# Patient Record
Sex: Female | Born: 1945 | Race: White | Hispanic: No | Marital: Married | Smoking: Former smoker
Health system: Southern US, Community
[De-identification: ages and names within clinical notes are randomized; demographics above are authoritative.]

## PROBLEM LIST (undated history)

## (undated) DIAGNOSIS — I609 Nontraumatic subarachnoid hemorrhage, unspecified: Secondary | ICD-10-CM

## (undated) DIAGNOSIS — I10 Essential (primary) hypertension: Secondary | ICD-10-CM

## (undated) HISTORY — PX: HERNIA REPAIR: SHX51

## (undated) HISTORY — PX: BREAST SURGERY: SHX581

## (undated) HISTORY — PX: BREAST EXCISIONAL BIOPSY: SUR124

## (undated) HISTORY — PX: VEIN LIGATION AND STRIPPING: SHX2653

## (undated) HISTORY — PX: OTHER SURGICAL HISTORY: SHX169

---

## 2017-06-15 DIAGNOSIS — R9431 Abnormal electrocardiogram [ECG] [EKG]: Secondary | ICD-10-CM | POA: Insufficient documentation

## 2017-06-15 DIAGNOSIS — E782 Mixed hyperlipidemia: Secondary | ICD-10-CM | POA: Insufficient documentation

## 2017-06-15 DIAGNOSIS — R002 Palpitations: Secondary | ICD-10-CM | POA: Insufficient documentation

## 2017-07-06 DIAGNOSIS — R7401 Elevation of levels of liver transaminase levels: Secondary | ICD-10-CM | POA: Insufficient documentation

## 2019-01-09 ENCOUNTER — Encounter: Payer: Self-pay | Admitting: Family Medicine

## 2019-01-14 ENCOUNTER — Ambulatory Visit (INDEPENDENT_AMBULATORY_CARE_PROVIDER_SITE_OTHER): Payer: Medicare Other

## 2019-01-14 ENCOUNTER — Encounter: Payer: Self-pay | Admitting: Podiatry

## 2019-01-14 ENCOUNTER — Ambulatory Visit (INDEPENDENT_AMBULATORY_CARE_PROVIDER_SITE_OTHER): Payer: Medicare Other | Admitting: Podiatry

## 2019-01-14 ENCOUNTER — Other Ambulatory Visit: Payer: Self-pay

## 2019-01-14 VITALS — BP 127/61 | HR 68

## 2019-01-14 DIAGNOSIS — M19072 Primary osteoarthritis, left ankle and foot: Secondary | ICD-10-CM

## 2019-01-14 DIAGNOSIS — M2062 Acquired deformities of toe(s), unspecified, left foot: Secondary | ICD-10-CM

## 2019-01-14 DIAGNOSIS — M205X1 Other deformities of toe(s) (acquired), right foot: Secondary | ICD-10-CM

## 2019-01-14 DIAGNOSIS — M2060 Acquired deformities of toe(s), unspecified, unspecified foot: Secondary | ICD-10-CM | POA: Insufficient documentation

## 2019-01-14 NOTE — Progress Notes (Addendum)
This patient presents to the office for an evaluation and treatment of both her feet.  She presents the office stating she has a painful callus on the tip of the fourth toe right foot and a painful callus under her big toe left foot.  She says she has had multiple surgeries on her feet.  She says the callus on the tip of her fourth toe right foot is painful walking and wearing her shoes.  She has a callus under her big toe and along the first and second toes left foot.  Patient has had multiple surgeries to the first ray of her left foot.  She says that her big toe sits up and her second toe rubs against it forming a callus.  She also has a callus under the joint in the big toe left foot.  She presents the office today requesting treatment for the elimination of the pain caused by the fourth toe right foot and great toe left foot.  Vascular  Dorsalis pedis and posterior tibial pulses are palpable  B/L.  Capillary return  WNL.  Temperature gradient is  WNL.  Skin turgor  WNL  Sensorium  Senn Weinstein monofilament wire  WNL. Normal tactile sensation.  Nail Exam  Patient has normal nails with no evidence of bacterial or fungal infection.  Orthopedic  Exam  Muscle tone and muscle strength  WNL.  Examination of the big toe joint of the left foot reveals the absence of motion.  The distal phalanx of the left hallux is in a dorsiflexed position relative to the IPJ left foot.  Patient also has a contracted fourth digit which leads to contracture and callus formation.  Midfoot arthritis.  Skin  No open lesions.  Normal skin texture and turgor.  Callus distal aspect 4th toe right foot.  Callus under plantar condyle head proximal phalanx left hallux.   Callus 1/2 left foot.  Mallet toe 4th toe right foot.  Arthrodesis revealing no motion 1st MPJ left foot.  IE.  X-rays taken revealed complete fusion IPJ left foot.  There appears to be monofilament wire which indicates an aching deformity was corrected surgically  patient also has a medial deviation of the second through fourth digits left foot.  Discussed this condition with this patient.  Recommended she be evaluated by Dr. March Rummage in [place] for a flexor tenotomy fourth toe right and surgical consultation for calluses left hallux hallux.  RTC prn   Gardiner Barefoot DPM.

## 2019-02-13 ENCOUNTER — Other Ambulatory Visit: Payer: Self-pay

## 2019-02-13 ENCOUNTER — Encounter (INDEPENDENT_AMBULATORY_CARE_PROVIDER_SITE_OTHER): Payer: Self-pay

## 2019-02-13 ENCOUNTER — Ambulatory Visit (INDEPENDENT_AMBULATORY_CARE_PROVIDER_SITE_OTHER): Payer: Medicare Other | Admitting: Gastroenterology

## 2019-02-13 VITALS — BP 111/58 | HR 62 | Temp 98.1°F | Ht 66.0 in | Wt 141.8 lb

## 2019-02-13 DIAGNOSIS — K219 Gastro-esophageal reflux disease without esophagitis: Secondary | ICD-10-CM

## 2019-02-13 DIAGNOSIS — R6881 Early satiety: Secondary | ICD-10-CM

## 2019-02-13 NOTE — Progress Notes (Signed)
Jonathon Bellows MD, MRCP(U.K) [address]  [place]  [place], [place] [address]  Main: [phone]  Fax: [phone]   Gastroenterology Consultation  Referring Provider:     Hetty Blend, DO Primary Care Physician:  Hetty Blend, DO Primary Gastroenterologist:  Dr. Jonathon Bellows  Reason for Consultation:     Barrett's esophagus and hiatal hernia        HPI:   Diane Kirby is a 73 y.o. y/o female referred for consultation & management  by Dr. Lafe Garin, Camelia Phenes, DO.    She says that she has had Barrett's esophagus for almost 30 years.  Last endoscopy was in 2019.  We did have the report scanned which showed a normal esophagus but showed a hiatal hernia.  Biopsies were taken for celiac disease and to rule out H. pylori.  I could not find any results of the same.  She was apprised that her esophagus showed no Barrett's on her last report.  She states she has been having features of early satiety and feels that the food sits in the stomach for hours for the last few months.  She denies suffering from diabetes or taking any pain medications.  She says that when she moves she feels that the food comes up into her throat.  She takes omeprazole 40 mg once a day.  She has gained weight recently.  No past medical history on file.  No past surgical history on file.  Prior to Admission medications   Medication Sig Start Date End Date Taking? Authorizing Provider  amLODipine (NORVASC) 10 MG tablet  12/22/18   [provider]  atenolol (TENORMIN) 50 MG tablet Take by mouth.    [provider]  Calcium Carbonate (CALCIUM 600 PO) Take by mouth daily.    [provider]  Cholecalciferol (VITAMIN D3 PO) Take by mouth. 5000    [provider]  FLUoxetine (PROZAC) 40 MG capsule Take by mouth.    [provider]  fluticasone Asencion Islam) 50 MCG/ACT nasal spray  06/13/18   [provider]  ipratropium (ATROVENT) 0.03 % nasal spray   04/06/18   [provider]  Multiple Vitamin (MULTIVITAMIN) capsule Take 1 capsule by mouth daily.    [provider]  omeprazole (PRILOSEC) 40 MG capsule  12/11/18   [provider]  pravastatin (PRAVACHOL) 40 MG tablet  12/25/18   [provider]  traZODone (DESYREL) 50 MG tablet  12/05/18   [provider]    No family history on file.   Social History   Tobacco Use  . Smoking status: Never Smoker  . Smokeless tobacco: Never Used  Substance Use Topics  . Alcohol use: Yes    Frequency: Never    Comment: 2 glasses of wine daily  . Drug use: Never    Allergies as of 02/13/2019  . (No Known Allergies)    Review of Systems:    All systems reviewed and negative except where noted in HPI.   Physical Exam:  BP (!) 111/58   Pulse 62   Temp 98.1 F (36.7 C)   Ht 5\' 6"  (1.676 m)   Wt 141 lb 12.8 oz (64.3 kg)   BMI 22.89 kg/m  No LMP recorded. Psych:  Alert and cooperative. Normal mood and affect. General:   Alert,  Well-developed, well-nourished, pleasant and cooperative in NAD Head:  Normocephalic and atraumatic. Eyes:  Sclera clear, no icterus.   Conjunctiva pink. Ears:  Normal auditory acuity.  Nose:  No deformity, discharge, or lesions. Mouth:  No deformity or lesions,oropharynx pink & moist. Neck:  Supple; no masses or thyromegaly. Lungs:  Respirations even and unlabored.  Clear throughout to auscultation.   No wheezes, crackles, or rhonchi. No acute distress. Heart:  Regular rate and rhythm; no murmurs, clicks, rubs, or gallops. Abdomen:  Normal bowel sounds.  No bruits.  Soft, non-tender and non-distended without masses, hepatosplenomegaly or hernias noted.  No guarding or rebound tenderness.    Neurologic:  Alert and oriented x3;  grossly normal neurologically. Skin:  Intact without significant lesions or rashes. No jaundice. Lymph Nodes:  No significant cervical adenopathy. Psych:  Alert and cooperative. Normal mood and  affect.  Imaging Studies: Dg Foot Complete Left  Result Date: 01/14/2019 Please see detailed radiograph report in office note.   Assessment and Plan:   Diane Kirby is a 73 y.o. y/o female has been referred for Barrett's esophagus.  Last upper endoscopy report in 2019 at [facility] [place] does not show any evidence of Barrett's esophagus.  Rather it shows a normal-appearing esophagus.  This is an contrast to her personal history which states that she has had it for over 20 years.  In addition she demonstrates symptoms suggestive of possible gastroparesis.   Plan 1.  EGD to evaluate for gastric outlet obstruction, evaluate esophagus to confirm there is no evidence of Barrett's. 2.  Gastric emptying study 3.  Continue PPI 4.  GERD discussed lifestyle changes. I have discussed alternative options, risks & benefits,  which include, but are not limited to, bleeding, infection, perforation,respiratory complication & drug reaction.  The patient agrees with this plan & written consent will be obtained.    Follow up in 6 weeks   Dr Wyline Mood MD,MRCP(U.K)

## 2019-02-13 NOTE — Patient Instructions (Signed)
Gastroparesis ° °Gastroparesis is a condition in which food takes longer than normal to empty from the stomach. The condition is usually long-lasting (chronic). It may also be called delayed gastric emptying. °There is no cure, but there are treatments and things that you can do at home to help relieve symptoms. Treating the underlying condition that causes gastroparesis can also help relieve symptoms. °What are the causes? °In many cases, the cause of this condition is not known. Possible causes include: °· A hormone (endocrine) disorder, such as hypothyroidism or diabetes. °· A nervous system disease, such as Parkinson's disease or multiple sclerosis. °· Cancer, infection, or surgery that affects the stomach or vagus nerve. The vagus nerve runs from your chest, through your neck, to the lower part of your brain. °· A connective tissue disorder, such as scleroderma. °· Certain medicines. °What increases the risk? °You are more likely to develop this condition if you: °· Have certain disorders or diseases, including: °? An endocrine disorder. °? An eating disorder. °? Amyloidosis. °? Scleroderma. °? Parkinson's disease. °? Multiple sclerosis. °? Cancer or infection of the stomach or the vagus nerve. °· Have had surgery on the stomach or vagus nerve. °· Take certain medicines. °· Are female. °What are the signs or symptoms? °Symptoms of this condition include: °· Feeling full after eating very little. °· Nausea. °· Vomiting. °· Heartburn. °· Abdominal bloating. °· Inconsistent blood sugar (glucose) levels on blood tests. °· Lack of appetite. °· Weight loss. °· Acid from the stomach coming up into the esophagus (gastroesophageal reflux). °· Sudden tightening (spasm) of the stomach, which can be painful. °Symptoms may come and go. Some people may not notice any symptoms. °How is this diagnosed? °This condition is diagnosed with tests, such as: °· Tests that check how long it takes food to move through the stomach and  intestines. These tests include: °? Upper gastrointestinal (GI) series. For this test, you drink a liquid that shows up well on X-rays, and then X-rays will be taken of your intestines. °? Gastric emptying scintigraphy. For this test, you eat food that contains a small amount of radioactive material, and then scans are taken. °? Wireless capsule GI monitoring system. For this test, you swallow a pill (capsule) that records information about how foods and fluid move through your stomach. °· Gastric manometry. For this test, a tube is passed down your throat and into your stomach to measure electrical and muscular activity. °· Endoscopy. For this test, a long, thin tube is passed down your throat and into your stomach to check for problems in your stomach lining. °· Ultrasound. This test uses sound waves to create images of inside the body. This can help rule out gallbladder disease or pancreatitis as a cause of your symptoms. °How is this treated? °There is no cure for gastroparesis. Treatment may include: °· Treating the underlying cause. °· Managing your symptoms by making changes to your diet and exercise habits. °· Taking medicines to control nausea and vomiting and to stimulate stomach muscles. °· Getting food through a feeding tube in the hospital. This may be done in severe cases. °· Having surgery to insert a device into your body that helps improve stomach emptying and control nausea and vomiting (gastric neurostimulator). °Follow these instructions at home: °· Take over-the-counter and prescription medicines only as told by your health care provider. °· Follow instructions from your health care provider about eating or drinking restrictions. Your health care provider may recommend that you: °? Eat   smaller meals more often. °? Eat low-fat foods. °? Eat low-fiber forms of high-fiber foods. For example, eat cooked vegetables instead of raw vegetables. °? Have only liquid foods instead of solid foods. Liquid  foods are easier to digest. °· Drink enough fluid to keep your urine pale yellow. °· Exercise as often as told by your health care provider. °· Keep all follow-up visits as told by your health care provider. This is important. °Contact a health care provider if you: °· Notice that your symptoms do not improve with treatment. °· Have new symptoms. °Get help right away if you: °· Have severe abdominal pain that does not improve with treatment. °· Have nausea that is severe or does not go away. °· Cannot drink fluids without vomiting. °Summary °· Gastroparesis is a chronic condition in which food takes longer than normal to empty from the stomach. °· Symptoms include nausea, vomiting, heartburn, abdominal bloating, and loss of appetite. °· Eating smaller portions, and low-fat, low-fiber foods may help you manage your symptoms. °· Get help right away if you have severe abdominal pain. °This information is not intended to replace advice given to you by your health care provider. Make sure you discuss any questions you have with your health care provider. °Document Released: 04/11/2005 Document Revised: 07/10/2017 Document Reviewed: 02/14/2017 °Elsevier Patient Education © 2020 [employer] Inc. ° °

## 2019-02-21 ENCOUNTER — Ambulatory Visit: Payer: Medicare Other | Admitting: Podiatry

## 2019-02-22 ENCOUNTER — Other Ambulatory Visit: Payer: Self-pay

## 2019-02-22 ENCOUNTER — Other Ambulatory Visit
Admission: RE | Admit: 2019-02-22 | Discharge: 2019-02-22 | Disposition: A | Discharge status: Home / Self Care | Payer: Medicare Other | Source: Ambulatory Visit | Attending: Gastroenterology | Admitting: Gastroenterology

## 2019-02-22 DIAGNOSIS — Z01812 Encounter for preprocedural laboratory examination: Secondary | ICD-10-CM | POA: Insufficient documentation

## 2019-02-22 DIAGNOSIS — Z20828 Contact with and (suspected) exposure to other viral communicable diseases: Secondary | ICD-10-CM | POA: Diagnosis not present

## 2019-02-22 LAB — SARS CORONAVIRUS 2 (TAT 6-24 HRS): SARS Coronavirus 2: NEGATIVE

## 2019-02-25 ENCOUNTER — Encounter: Payer: Self-pay | Admitting: *Deleted

## 2019-02-25 ENCOUNTER — Other Ambulatory Visit: Payer: Self-pay

## 2019-02-25 ENCOUNTER — Telehealth: Payer: Self-pay | Admitting: Gastroenterology

## 2019-02-25 ENCOUNTER — Encounter: Payer: Self-pay | Admitting: Gastroenterology

## 2019-02-25 ENCOUNTER — Ambulatory Visit
Admission: RE | Admit: 2019-02-25 | Discharge: 2019-02-25 | Disposition: A | Discharge status: Home / Self Care | Payer: Medicare Other | Source: Ambulatory Visit | Attending: Gastroenterology | Admitting: Gastroenterology

## 2019-02-25 DIAGNOSIS — R6881 Early satiety: Secondary | ICD-10-CM | POA: Insufficient documentation

## 2019-02-25 MED ORDER — TECHNETIUM TC 99M SULFUR COLLOID
2.0000 | Freq: Once | INTRAVENOUS | Status: AC | PRN
  Administered 2019-02-25: 2.82 via ORAL

## 2019-02-25 NOTE — Telephone Encounter (Signed)
Pt daughter left vm she states pt will not cancel the procedure she  Was just frustrated bc she could not find the location of the medical  Mall please keep apt

## 2019-02-25 NOTE — Telephone Encounter (Signed)
Pt left vm to cancel her procedure with Dr. Vicente Males on 02/26/19

## 2019-02-26 ENCOUNTER — Encounter
Admission: RE | Disposition: A | Discharge status: Home / Self Care | Payer: Self-pay | Source: Ambulatory Visit | Attending: Gastroenterology

## 2019-02-26 ENCOUNTER — Ambulatory Visit
Admission: RE | Admit: 2019-02-26 | Discharge: 2019-02-26 | Disposition: A | Discharge status: Home / Self Care | Payer: Medicare Other | Source: Ambulatory Visit | Attending: Gastroenterology | Admitting: Gastroenterology

## 2019-02-26 ENCOUNTER — Other Ambulatory Visit: Payer: Self-pay

## 2019-02-26 ENCOUNTER — Ambulatory Visit: Payer: Medicare Other | Admitting: Certified Registered Nurse Anesthetist

## 2019-02-26 ENCOUNTER — Encounter: Payer: Self-pay | Admitting: *Deleted

## 2019-02-26 DIAGNOSIS — K2970 Gastritis, unspecified, without bleeding: Secondary | ICD-10-CM | POA: Diagnosis not present

## 2019-02-26 DIAGNOSIS — Z87891 Personal history of nicotine dependence: Secondary | ICD-10-CM | POA: Insufficient documentation

## 2019-02-26 DIAGNOSIS — Z7951 Long term (current) use of inhaled steroids: Secondary | ICD-10-CM | POA: Insufficient documentation

## 2019-02-26 DIAGNOSIS — K319 Disease of stomach and duodenum, unspecified: Secondary | ICD-10-CM | POA: Insufficient documentation

## 2019-02-26 DIAGNOSIS — Z79899 Other long term (current) drug therapy: Secondary | ICD-10-CM | POA: Insufficient documentation

## 2019-02-26 DIAGNOSIS — R6881 Early satiety: Secondary | ICD-10-CM | POA: Diagnosis not present

## 2019-02-26 DIAGNOSIS — K2270 Barrett's esophagus without dysplasia: Secondary | ICD-10-CM

## 2019-02-26 DIAGNOSIS — K219 Gastro-esophageal reflux disease without esophagitis: Secondary | ICD-10-CM | POA: Diagnosis not present

## 2019-02-26 HISTORY — PX: ESOPHAGOGASTRODUODENOSCOPY (EGD) WITH PROPOFOL: SHX5813

## 2019-02-26 HISTORY — DX: Nontraumatic subarachnoid hemorrhage, unspecified: I60.9

## 2019-02-26 SURGERY — ESOPHAGOGASTRODUODENOSCOPY (EGD) WITH PROPOFOL
Anesthesia: General

## 2019-02-26 MED ORDER — LIDOCAINE HCL (CARDIAC) PF 100 MG/5ML IV SOSY
PREFILLED_SYRINGE | INTRAVENOUS | Status: DC | PRN
  Administered 2019-02-26: 50 mg via INTRAVENOUS

## 2019-02-26 MED ORDER — PROPOFOL 500 MG/50ML IV EMUL
INTRAVENOUS | Status: AC
  Filled 2019-02-26: qty 50

## 2019-02-26 MED ORDER — PROPOFOL 10 MG/ML IV BOLUS
INTRAVENOUS | Status: DC | PRN
  Administered 2019-02-26: 30 mg via INTRAVENOUS
  Administered 2019-02-26: 60 mg via INTRAVENOUS

## 2019-02-26 MED ORDER — PROPOFOL 500 MG/50ML IV EMUL
INTRAVENOUS | Status: DC | PRN
  Administered 2019-02-26: 175 ug/kg/min via INTRAVENOUS

## 2019-02-26 MED ORDER — LIDOCAINE HCL (PF) 2 % IJ SOLN
INTRAMUSCULAR | Status: AC
  Filled 2019-02-26: qty 10

## 2019-02-26 MED ORDER — SODIUM CHLORIDE 0.9 % IV SOLN
INTRAVENOUS | Status: DC
  Administered 2019-02-26: 08:00:00 via INTRAVENOUS

## 2019-02-26 NOTE — Transfer of Care (Signed)
Immediate Anesthesia Transfer of Care Note  Patient: TOLA MEAS  Procedure(s) Performed: ESOPHAGOGASTRODUODENOSCOPY (EGD) WITH PROPOFOL (N/A )  Patient Location: PACU  Anesthesia Type:General  Level of Consciousness: sedated  Airway & Oxygen Therapy: Patient Spontanous Breathing  Post-op Assessment: Report given to RN and Post -op Vital signs reviewed and stable  Post vital signs: Reviewed and stable  Last Vitals:  Vitals Value Taken Time  BP 111/59 02/26/19 0815  Temp    Pulse 67 02/26/19 0815  Resp 21 02/26/19 0815  SpO2 95 % 02/26/19 0815  Vitals shown include unvalidated device data.  Last Pain:  Vitals:   02/26/19 0733  TempSrc: Axillary         Complications: none

## 2019-02-26 NOTE — Op Note (Signed)
[facility] Gastroenterology Patient Name: Diane Kirby Procedure Date: 02/26/2019 7:31 AM MRN: [id number] Account #: [id number] Date of Birth: November 09, 1945 Admit Type: Outpatient Age: 73 Room: [facility] ENDO ROOM 2 Gender: Female Note Status: Finalized Procedure:             Upper GI endoscopy Indications:           Follow-up of Barrett's esophagus Providers:             Wyline Mood MD, MD Medicines:             Monitored Anesthesia Care Complications:         No immediate complications. Procedure:             Pre-Anesthesia Assessment:                        - Prior to the procedure, a History and Physical was                         performed, and patient medications, allergies and                         sensitivities were reviewed. The patient's tolerance                         of previous anesthesia was reviewed.                        - The risks and benefits of the procedure and the                         sedation options and risks were discussed with the                         patient. All questions were answered and informed                         consent was obtained.                        - ASA Grade Assessment: II - A patient with mild                         systemic disease.                        After obtaining informed consent, the endoscope was                         passed under direct vision. Throughout the procedure,                         the patient's blood pressure, pulse, and oxygen                         saturations were monitored continuously. The Endoscope                         was introduced through the mouth, and advanced to the  third part of duodenum. The upper GI endoscopy was                         accomplished with ease. The patient tolerated the                         procedure well. Findings:      The examined duodenum was normal.      Localized mild inflammation characterized by congestion  (edema) and       erythema was found in the gastric antrum. Biopsies were taken with a       cold forceps for histology.      The cardia and gastric fundus were normal on retroflexion.      The esophagus was normal. Impression:            - Normal examined duodenum.                        - Gastritis. Biopsied.                        - Normal esophagus. Recommendation:        - Await pathology results.                        - Discharge patient to home (with escort).                        - Resume previous diet.                        - Continue present medications.                        - Return to my office as previously scheduled. Procedure Code(s):     --- Professional ---                        [phone], Esophagogastroduodenoscopy, flexible,                         transoral; with biopsy, single or multiple Diagnosis Code(s):     --- Professional ---                        K22.70, Barrett's esophagus without dysplasia                        K29.70, Gastritis, unspecified, without bleeding CPT copyright 2019 American Medical Association. All rights reserved. The codes documented in this report are preliminary and upon coder review may  be revised to meet current compliance requirements. Jonathon Bellows, MD Jonathon Bellows MD, MD 02/26/2019 8:11:33 AM This report has been signed electronically. Number of Addenda: 0 Note Initiated On: 02/26/2019 7:31 AM Estimated Blood Loss:  Estimated blood loss: none.      [facility]

## 2019-02-26 NOTE — H&P (Signed)
Wyline Mood, MD [address], [place], [place], [address] [address], [address], [place], [place], [address] Phone: [phone]  Fax: [phone]  Primary Care Physician:  Fara Olden, DO   Pre-Procedure History & Physical: HPI:  Diane Kirby is a 73 y.o. female is here for an endoscopy    Past Medical History:  Diagnosis Date  . 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 big toe surgery    . BREAST SURGERY    . CESAREAN SECTION     x3  . HERNIA REPAIR    . VEIN LIGATION AND STRIPPING      Prior to Admission medications   Medication Sig Start Date End Date Taking? Authorizing Provider  amLODipine (NORVASC) 10 MG tablet  12/22/18  Yes [provider]  atenolol (TENORMIN) 50 MG tablet Take by mouth.   Yes [provider]  Calcium Carbonate (CALCIUM 600 PO) Take by mouth daily.   Yes [provider]  Cholecalciferol (VITAMIN D3 PO) Take by mouth. 5000   Yes [provider]  FLUoxetine (PROZAC) 40 MG capsule Take by mouth.   Yes [provider]  fluticasone Aleda Grana) 50 MCG/ACT nasal spray  06/13/18  Yes [provider]  ipratropium (ATROVENT) 0.03 % nasal spray  04/06/18  Yes [provider]  Multiple Vitamin (MULTIVITAMIN) capsule Take 1 capsule by mouth daily.   Yes [provider]  omeprazole (PRILOSEC) 40 MG capsule  12/11/18  Yes [provider]  pravastatin (PRAVACHOL) 40 MG tablet  12/25/18  Yes [provider]  traZODone (DESYREL) 50 MG tablet  12/05/18  Yes [provider]    Allergies as of 02/13/2019  . (No Known Allergies)    History reviewed. No pertinent family history.  Social History   Socioeconomic History  . Marital status: Married    Spouse name: Not on file  . Number of children: Not on file  . Years of education: Not on file  . Highest education level: Not on  file  Occupational History  . Not on file  Social Needs  . Financial resource strain: Not on file  . Food insecurity    Worry: Not on file    Inability: Not on file  . Transportation needs    Medical: Not on file    Non-medical: Not on file  Tobacco Use  . Smoking status: Former Smoker    Packs/day: 1.00    Years: 40.00    Pack years: 40.00    Types: Cigarettes    Quit date: 02/26/2000    Years since quitting: 19.0  . Smokeless tobacco: Never Used  Substance and Sexual Activity  . Alcohol use: Yes    Frequency: Never    Comment: 2 glasses of wine daily  . Drug use: Never  . Sexual activity: Not on file  Lifestyle  . Physical activity    Days per week: Not on file    Minutes per session: Not on file  . Stress: Not on file  Relationships  . Social Musician on phone: Not on file    Gets together: Not on file    Attends religious service: Not on file    Active member of club or organization: Not on file    Attends meetings of clubs or organizations: Not on file    Relationship status: Not on file  .  Intimate partner violence    Fear of current or ex partner: Not on file    Emotionally abused: Not on file    Physically abused: Not on file    Forced sexual activity: Not on file  Other Topics Concern  . Not on file  Social History Narrative  . Not on file    Review of Systems: See HPI, otherwise negative ROS  Physical Exam: BP (!) 116/57   Pulse 64   Temp 98.3 F (36.8 C) (Axillary)   Resp 18   Ht 5\' 6"  (1.676 m)   Wt 63.5 kg   LMP  (LMP Unknown)   SpO2 100%   BMI 22.60 kg/m  General:   Alert,  pleasant and cooperative in NAD Head:  Normocephalic and atraumatic. Neck:  Supple; no masses or thyromegaly. Lungs:  Clear throughout to auscultation, normal respiratory effort.    Heart:  +S1, +S2, Regular rate and rhythm, No edema. Abdomen:  Soft, nontender and nondistended. Normal bowel sounds, without guarding, and without rebound.   Neurologic:   Alert and  oriented x4;  grossly normal neurologically.  Impression/Plan: Diane Kirby is here for an endoscopy  to be performed for  evaluation of barrettes esophagus    Risks, benefits, limitations, and alternatives regarding endoscopy have been reviewed with the patient.  Questions have been answered.  All parties agreeable.   Jonathon Bellows, MD  02/26/2019, 7:58 AM

## 2019-02-26 NOTE — Anesthesia Preprocedure Evaluation (Addendum)
Anesthesia Evaluation  Patient identified by MRN, date of birth, ID band Patient awake    Reviewed: Allergy & Precautions, H&P , NPO status , Patient's Chart, lab work & pertinent test results, reviewed documented beta blocker date and time   History of Anesthesia Complications Negative for: history of anesthetic complications  Airway Mallampati: II  TM Distance: >3 FB Neck ROM: full    Dental  (+) Dental Advidsory Given, Teeth Intact   Pulmonary neg pulmonary ROS, former smoker,    Pulmonary exam normal        Cardiovascular Exercise Tolerance: Good hypertension, (-) angina(-) CAD, (-) Past MI and (-) Cardiac Stents Normal cardiovascular exam(-) dysrhythmias (-) Valvular Problems/Murmurs     Neuro/Psych neg Seizures Brain aneurysm s/p bleed and clipping. 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 (HCC)   Reproductive/Obstetrics negative OB ROS                            Anesthesia Physical Anesthesia Plan  ASA: II  Anesthesia Plan: General   Post-op Pain Management:    Induction: Intravenous  PONV Risk Score and Plan: 3 and Propofol infusion and TIVA  Airway Management Planned: Natural Airway and Nasal Cannula  Additional Equipment:   Intra-op Plan:   Post-operative Plan:   Informed Consent: I have reviewed the patients History and Physical, chart, labs and discussed the procedure including the risks, benefits and alternatives for the proposed anesthesia with the patient or authorized representative who has indicated his/her understanding and acceptance.     Dental Advisory Given  Plan Discussed  with: Anesthesiologist, CRNA and Surgeon  Anesthesia Plan Comments:         Anesthesia Quick Evaluation

## 2019-02-26 NOTE — Anesthesia Post-op Follow-up Note (Signed)
Anesthesia QCDR form completed.        

## 2019-02-26 NOTE — Anesthesia Procedure Notes (Signed)
Date/Time: 02/26/2019 8:01 AM Performed by: Johnna Acosta, CRNA Pre-anesthesia Checklist: Patient identified, Emergency Drugs available, Suction available, Timeout performed and Patient being monitored Patient Re-evaluated:Patient Re-evaluated prior to induction Oxygen Delivery Method: Nasal cannula Preoxygenation: Pre-oxygenation with 100% oxygen Induction Type: IV induction

## 2019-02-27 LAB — SURGICAL PATHOLOGY

## 2019-02-27 NOTE — Anesthesia Postprocedure Evaluation (Signed)
Anesthesia Post Note  Patient: Diane Kirby  Procedure(s) Performed: ESOPHAGOGASTRODUODENOSCOPY (EGD) WITH PROPOFOL (N/A )  Patient location during evaluation: Endoscopy Anesthesia Type: General Level of consciousness: awake and alert Pain management: pain level controlled Vital Signs Assessment: post-procedure vital signs reviewed and stable Respiratory status: spontaneous breathing, nonlabored ventilation, respiratory function stable and patient connected to nasal cannula oxygen Cardiovascular status: blood pressure returned to baseline and stable Postop Assessment: no apparent nausea or vomiting Anesthetic complications: no     Last Vitals:  Vitals:   02/26/19 0825 02/26/19 0835  BP: 109/61 (!) 114/53  Pulse: 63 63  Resp: 18 16  Temp:    SpO2: 99% 98%    Last Pain:  Vitals:   02/26/19 0835  TempSrc:   PainSc: 0-No pain                 Martha Clan

## 2019-02-28 ENCOUNTER — Encounter: Payer: Self-pay | Admitting: Gastroenterology

## 2019-03-05 ENCOUNTER — Encounter: Payer: Self-pay | Admitting: Gastroenterology

## 2019-03-28 ENCOUNTER — Other Ambulatory Visit: Payer: Self-pay

## 2019-03-28 ENCOUNTER — Encounter: Payer: Self-pay | Admitting: Gastroenterology

## 2019-03-28 ENCOUNTER — Encounter (INDEPENDENT_AMBULATORY_CARE_PROVIDER_SITE_OTHER): Payer: Self-pay

## 2019-03-28 ENCOUNTER — Ambulatory Visit (INDEPENDENT_AMBULATORY_CARE_PROVIDER_SITE_OTHER): Payer: Medicare Other | Admitting: Gastroenterology

## 2019-03-28 VITALS — BP 108/62 | HR 81 | Temp 98.0°F | Ht 66.0 in | Wt 146.6 lb

## 2019-03-28 DIAGNOSIS — K219 Gastro-esophageal reflux disease without esophagitis: Secondary | ICD-10-CM

## 2019-03-28 MED ORDER — OMEPRAZOLE 20 MG PO CPDR: 20.0000 mg | DELAYED_RELEASE_CAPSULE | Freq: Every day | ORAL | 3 refills | Status: AC

## 2019-03-28 NOTE — Progress Notes (Signed)
Diane Bellows MD, MRCP(U.K) [address]  [place]  [place], [place] [address]  Main: [phone]  Fax: [phone]   Primary Care Physician: Hetty Blend, DO  Primary Gastroenterologist:  Dr. Jonathon Kirby   Follow-up for Barrett's esophagus and early satiety   HPI: Diane Kirby is a 73 y.o. female    Summary of history :  She was initially seen and referred on 02/13/2019 for Barrett's esophagus.She says that she has had Barrett's esophagus for almost 30 years.  Last endoscopy was in 2019.  We did have the report scanned which showed a normal esophagus but showed a hiatal hernia.  Biopsies were taken for celiac disease and to rule out H. pylori.  I could not find any results of the same.  She was surprised that her esophagus showed no Barrett's on her last report.  She states she has been having features of early satiety and feels that the food sits in the stomach for hours for the last few months.  She denies suffering from diabetes or taking any pain medications.  She says that when she moves she feels that the food comes up into her throat.  She takes omeprazole 40 mg once a day.  She had gained weight recently.   Interval history 02/13/2019-03/28/2019  02/25/2019: Normal gastric emptying study 02/26/2019: EGD: Gastritis.  Normal-appearing GE junction.  In fact it was perfectly demarcated Biopsies of the stomach showed reactive gastropathy. On 40 mg of Prilosec daily no symptoms. Current Outpatient Medications  Medication Sig Dispense Refill  . amLODipine (NORVASC) 10 MG tablet     . atenolol (TENORMIN) 50 MG tablet Take by mouth.    . Calcium Carbonate (CALCIUM 600 PO) Take by mouth daily.    . Cholecalciferol (VITAMIN D3 PO) Take by mouth. 5000    . FLUoxetine (PROZAC) 40 MG capsule Take by mouth.    . fluticasone (FLONASE) 50 MCG/ACT nasal spray     . ipratropium (ATROVENT) 0.03 % nasal spray     . Multiple Vitamin (MULTIVITAMIN) capsule Take 1 capsule by  mouth daily.    Marland Kitchen omeprazole (PRILOSEC) 40 MG capsule     . pravastatin (PRAVACHOL) 40 MG tablet     . traZODone (DESYREL) 50 MG tablet      No current facility-administered medications for this visit.     Allergies as of 03/28/2019  . (No Known Allergies)    ROS:  General: Negative for anorexia, weight loss, fever, chills, fatigue, weakness. ENT: Negative for hoarseness, difficulty swallowing , nasal congestion. CV: Negative for chest pain, angina, palpitations, dyspnea on exertion, peripheral edema.  Respiratory: Negative for dyspnea at rest, dyspnea on exertion, cough, sputum, wheezing.  GI: See history of present illness. GU:  Negative for dysuria, hematuria, urinary incontinence, urinary frequency, nocturnal urination.  Endo: Negative for unusual weight change.    Physical Examination:   LMP  (LMP Unknown)   General: Well-nourished, well-developed in no acute distress.  Eyes: No icterus. Conjunctivae pink. Mouth: Oropharyngeal mucosa moist and pink , no lesions erythema or exudate. Lungs: Clear to auscultation bilaterally. Non-labored. Heart: Regular rate and rhythm, no murmurs rubs or gallops.  Abdomen: Bowel sounds are normal, nontender, nondistended, no hepatosplenomegaly or masses, no abdominal bruits or hernia , no rebound or guarding.   Extremities: No lower extremity edema. No clubbing or deformities. Neuro: Alert and oriented x 3.  Grossly intact. Skin: Warm and dry, no jaundice.   Psych: Alert and cooperative, normal mood and  affect.   Imaging Studies: No results found.  Assessment and Plan:   Diane Kirby is a 73 y.o. y/o female was initially referred for evaluation of Barrett's esophagus, a diagnosis she has carried for 30 years.  I performed an EGD and found no evidence of Barrett's esophagus endoscopically.  The GE junction was exactly at the upper margin of the gastric folds with no tongues of salmon appearing mucosa projecting proximally.  No  evidence of gastroparesis on the gastric emptying study.  All symptoms resolved after commencing on Prilosec 40 mg a day.  I will decrease it to 20 mg a day.  She has symptoms of gas and bloating and will commence in the low FODMAP diet.  Patient information provided.  At follow-up visit if she is doing well we will change her from Prilosec 20 mg to Pepcid.     Dr Wyline Mood  MD,MRCP [facility]) Follow up in 4 months

## 2020-07-27 ENCOUNTER — Other Ambulatory Visit: Payer: Self-pay | Admitting: Internal Medicine

## 2020-07-27 DIAGNOSIS — Z789 Other specified health status: Secondary | ICD-10-CM | POA: Insufficient documentation

## 2020-07-27 DIAGNOSIS — K219 Gastro-esophageal reflux disease without esophagitis: Secondary | ICD-10-CM | POA: Insufficient documentation

## 2020-07-27 DIAGNOSIS — R1084 Generalized abdominal pain: Secondary | ICD-10-CM

## 2020-08-11 ENCOUNTER — Ambulatory Visit
Admission: RE | Admit: 2020-08-11 | Discharge: 2020-08-11 | Disposition: A | Discharge status: Home / Self Care | Payer: Medicare Other | Source: Ambulatory Visit | Attending: Internal Medicine | Admitting: Internal Medicine

## 2020-08-11 ENCOUNTER — Other Ambulatory Visit: Payer: Self-pay

## 2020-08-11 DIAGNOSIS — R1084 Generalized abdominal pain: Secondary | ICD-10-CM | POA: Insufficient documentation

## 2020-08-11 HISTORY — DX: Essential (primary) hypertension: I10

## 2020-08-11 MED ORDER — IOHEXOL 300 MG/ML  SOLN
100.0000 mL | Freq: Once | INTRAMUSCULAR | Status: AC | PRN
  Administered 2020-08-11: 100 mL via INTRAVENOUS

## 2020-09-07 ENCOUNTER — Other Ambulatory Visit: Payer: Self-pay | Admitting: Internal Medicine

## 2020-09-07 DIAGNOSIS — Z1231 Encounter for screening mammogram for malignant neoplasm of breast: Secondary | ICD-10-CM

## 2020-12-11 ENCOUNTER — Other Ambulatory Visit (INDEPENDENT_AMBULATORY_CARE_PROVIDER_SITE_OTHER): Payer: Self-pay | Admitting: Nurse Practitioner

## 2020-12-11 DIAGNOSIS — I8312 Varicose veins of left lower extremity with inflammation: Secondary | ICD-10-CM

## 2020-12-11 DIAGNOSIS — I8311 Varicose veins of right lower extremity with inflammation: Secondary | ICD-10-CM

## 2020-12-13 DIAGNOSIS — J45909 Unspecified asthma, uncomplicated: Secondary | ICD-10-CM | POA: Insufficient documentation

## 2020-12-13 DIAGNOSIS — I8310 Varicose veins of unspecified lower extremity with inflammation: Secondary | ICD-10-CM | POA: Insufficient documentation

## 2020-12-13 DIAGNOSIS — I872 Venous insufficiency (chronic) (peripheral): Secondary | ICD-10-CM | POA: Insufficient documentation

## 2020-12-13 DIAGNOSIS — I10 Essential (primary) hypertension: Secondary | ICD-10-CM | POA: Insufficient documentation

## 2020-12-13 NOTE — Progress Notes (Signed)
MRN : [id number]  Diane Kirby is a 75 y.o. (23-Jun-1945) female who presents with chief complaint of varicose veins.  History of Present Illness:   The patient is seen for evaluation of symptomatic varicose veins. The patient relates burning and aching (like a toothache)  which worsened particularly with driving and to a lessor extent sitting. The patient also notes an aching and throbbing pain over the varicosities. The symptoms are improved with elevation.  At this point, the symptoms are persistent and severe enough that they're having a negative impact on lifestyle and are interfering with daily activities.  She has a history of right leg vein stripping (approximately 40 years ago) no sclerotherapy at that time.  There is no history of DVT, PE or superficial thrombophlebitis. There is no history of ulceration or hemorrhage. The patient denies a significant family history of varicose veins.  The patient has worn graduated compression in the past. At the present time the patient has not been using over-the-counter analgesics.    No outpatient medications have been marked as taking for the 12/14/20 encounter (Appointment) with Gilda Crease, Latina Craver, MD.    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 [facility])    Hypertension     Past Surgical History:  Procedure Laterality Date   big toe surgery     BREAST SURGERY     CESAREAN SECTION     x3   ESOPHAGOGASTRODUODENOSCOPY (EGD) WITH PROPOFOL N/A 02/26/2019   Procedure: ESOPHAGOGASTRODUODENOSCOPY (EGD) WITH PROPOFOL;  Surgeon: Wyline Mood, MD;  Location: [facility] ENDOSCOPY;  Service: Gastroenterology;  Laterality: N/A;   HERNIA REPAIR     VEIN LIGATION AND STRIPPING      Social History Social History   Tobacco Use   Smoking status: Former    Packs/day: 1.00    Years: 40.00    Pack years: 40.00    Types: Cigarettes    Quit date: 02/26/2000    Years since quitting: 20.8   Smokeless tobacco:  Never  Vaping Use   Vaping Use: Never used  Substance Use Topics   Alcohol use: Yes    Comment: 2 glasses of wine daily   Drug use: Never    Family History No family history on file.  No Known Allergies   REVIEW OF SYSTEMS (Negative unless checked)  Constitutional: [] Weight loss  [] Fever  [] Chills Cardiac: [] Chest pain   [] Chest pressure   [] Palpitations   [] Shortness of breath when laying flat   [] Shortness of breath with exertion. Vascular:  [] Pain in legs with walking   [] Pain in legs at rest  [] History of DVT   [] Phlebitis   [x] Swelling in legs   [] Varicose veins   [] Non-healing ulcers Pulmonary:   [] Uses home oxygen   [] Productive cough   [] Hemoptysis   [] Wheeze  [] COPD   [] Asthma Neurologic:  [] Dizziness   [] Seizures   [] History of stroke   [] History of TIA  [] Aphasia   [] Vissual changes   [] Weakness or numbness in arm   [] Weakness or numbness in leg Musculoskeletal:   [] Joint swelling   [] Joint pain   [] Low back pain Hematologic:  [] Easy bruising  [] Easy bleeding   [] Hypercoagulable state   [] Anemic Gastrointestinal:  [] Diarrhea   [] Vomiting  [] Gastroesophageal reflux/heartburn   [] Difficulty swallowing. Genitourinary:  [] Chronic kidney disease   [] Difficult urination  [] Frequent urination   [] Blood in urine Skin:  [] Rashes   [] Ulcers  Psychological:  [] History of anxiety   []   History of major depression.  Physical Examination  There were no vitals filed for this visit. There is no height or weight on file to calculate BMI. Gen: WD/WN, NAD Head: [place]/AT, No temporalis wasting.  Ear/Nose/Throat: Hearing grossly intact, nares w/o erythema or drainage, pinna without lesions Eyes: PER, EOMI, sclera nonicteric.  Neck: Supple, no gross masses.  No JVD.  Pulmonary:  Good air movement, no audible wheezing, no use of accessory muscles.  Cardiac: RRR, precordium not hyperdynamic. Vascular:  varicosities present bilaterally primarily in the popliteal fossa, 39mm in diameter.  Mild  venous stasis changes to the legs bilaterally.  1+ soft pitting edema  Vessel Right Left  Radial Palpable Palpable  Gastrointestinal: soft, non-distended. No guarding/no peritoneal signs.  Musculoskeletal: M/S 5/5 throughout.  No deformity.  Neurologic: CN 2-12 intact. Pain and light touch intact in extremities.  Symmetrical.  Speech is fluent. Motor exam as listed above. Psychiatric: Judgment intact, Mood & affect appropriate for pt's clinical situation. Dermatologic: Venous rashes no ulcers noted.  No changes consistent with cellulitis. Lymph : No lichenification or skin changes of chronic lymphedema.  CBC No results found for: WBC, HGB, HCT, MCV, PLT  BMET No results found for: NA, K, CL, CO2, GLUCOSE, BUN, CREATININE, CALCIUM, GFRNONAA, GFRAA CrCl cannot be calculated (No successful lab value found.).  COAG No results found for: INR, PROTIME  Radiology No results found.   Assessment/Plan 1. Varicose veins with inflammation Recommend:  The patient has had successful ablation of the previously incompetent saphenous venous system but still has persistent symptoms of pain and swelling that are having a negative impact on daily life and daily activities.  Patient should undergo injection sclerotherapy to treat the residual varicosities.  The risks, benefits and alternative therapies were reviewed in detail with the patient.  All questions were answered.  The patient agrees to proceed with sclerotherapy at their convenience.  The patient will continue wearing the graduated compression stockings and using the over-the-counter pain medications to treat her symptoms.     2. Chronic venous insufficiency No surgery or intervention at this point in time.    I have had a long discussion with the patient regarding venous insufficiency and why it  causes symptoms. I have discussed with the patient the chronic skin changes that accompany venous insufficiency and the long term sequela such  as infection and ulceration.  Patient will continue wearing graduated compression stockings on a daily basis a prescription was given. The patient will put the stockings on first thing in the morning and removing them in the evening.   In addition, behavioral modification including several periods of elevation of the lower extremities during the day will be continued. I have demonstrated that proper elevation is a position with the ankles at heart level.  The patient is instructed to begin routine exercise, especially walking on a daily basis   3. Chronic asthma without complication, unspecified asthma severity, unspecified whether persistent Continue pulmonary medications and aerosols as already ordered, these medications have been reviewed and there are no changes at this time.    4. Essential hypertension Continue antihypertensive medications as already ordered, these medications have been reviewed and there are no changes at this time.     Levora Dredge, MD  12/13/2020 12:41 PM

## 2020-12-14 ENCOUNTER — Encounter (INDEPENDENT_AMBULATORY_CARE_PROVIDER_SITE_OTHER): Payer: Self-pay | Admitting: Vascular Surgery

## 2020-12-14 ENCOUNTER — Ambulatory Visit (INDEPENDENT_AMBULATORY_CARE_PROVIDER_SITE_OTHER): Payer: Medicare Other | Admitting: Vascular Surgery

## 2020-12-14 ENCOUNTER — Ambulatory Visit (INDEPENDENT_AMBULATORY_CARE_PROVIDER_SITE_OTHER): Payer: Medicare Other

## 2020-12-14 ENCOUNTER — Other Ambulatory Visit: Payer: Self-pay

## 2020-12-14 DIAGNOSIS — I8310 Varicose veins of unspecified lower extremity with inflammation: Secondary | ICD-10-CM | POA: Diagnosis not present

## 2020-12-14 DIAGNOSIS — J45909 Unspecified asthma, uncomplicated: Secondary | ICD-10-CM | POA: Diagnosis not present

## 2020-12-14 DIAGNOSIS — I872 Venous insufficiency (chronic) (peripheral): Secondary | ICD-10-CM | POA: Diagnosis not present

## 2020-12-14 DIAGNOSIS — I8311 Varicose veins of right lower extremity with inflammation: Secondary | ICD-10-CM | POA: Diagnosis not present

## 2020-12-14 DIAGNOSIS — I8312 Varicose veins of left lower extremity with inflammation: Secondary | ICD-10-CM | POA: Diagnosis not present

## 2020-12-14 DIAGNOSIS — I10 Essential (primary) hypertension: Secondary | ICD-10-CM | POA: Diagnosis not present

## 2021-09-15 ENCOUNTER — Other Ambulatory Visit: Payer: Self-pay | Admitting: Internal Medicine

## 2021-09-17 ENCOUNTER — Other Ambulatory Visit: Payer: Self-pay | Admitting: Internal Medicine

## 2021-09-17 ENCOUNTER — Ambulatory Visit
Admission: RE | Admit: 2021-09-17 | Discharge: 2021-09-17 | Disposition: A | Discharge status: Home / Self Care | Payer: Medicare Other | Source: Ambulatory Visit | Attending: Internal Medicine | Admitting: Internal Medicine

## 2021-09-17 DIAGNOSIS — Z1231 Encounter for screening mammogram for malignant neoplasm of breast: Secondary | ICD-10-CM | POA: Diagnosis present

## 2021-09-29 ENCOUNTER — Inpatient Hospital Stay
Admission: RE | Admit: 2021-09-29 | Discharge: 2021-09-29 | Disposition: A | Discharge status: Home / Self Care | Payer: Self-pay | Source: Ambulatory Visit | Attending: *Deleted | Admitting: *Deleted

## 2021-09-29 ENCOUNTER — Other Ambulatory Visit: Payer: Self-pay | Admitting: *Deleted

## 2021-09-29 DIAGNOSIS — Z1231 Encounter for screening mammogram for malignant neoplasm of breast: Secondary | ICD-10-CM

## 2022-02-21 ENCOUNTER — Encounter (INDEPENDENT_AMBULATORY_CARE_PROVIDER_SITE_OTHER): Payer: Self-pay

## 2022-04-28 ENCOUNTER — Telehealth: Payer: Self-pay | Admitting: Gastroenterology

## 2022-04-28 NOTE — Telephone Encounter (Signed)
She has been seen by [facility] clinic hence would need to follow-up with them

## 2022-04-28 NOTE — Telephone Encounter (Signed)
Patient called office requesting to get a colonoscopy with Dr Vicente Males. However, the last time patient was seen in this office was 03/28/2019 and EGD was performed but not a colonoscopy. Then [place] out [place] the [place].  When she moved back to [place] she had consult at [facility] clinic on 01/21/2021. No procedure was performed. No new referral was placed since then.  Please advise.

## 2022-05-02 NOTE — Telephone Encounter (Signed)
Notified patient of Dr Georgeann Oppenheim comments. Patient stated that she will call [facility] clinic.

## 2022-05-19 ENCOUNTER — Other Ambulatory Visit: Payer: Self-pay | Admitting: Internal Medicine

## 2022-05-19 DIAGNOSIS — Z1231 Encounter for screening mammogram for malignant neoplasm of breast: Secondary | ICD-10-CM

## 2022-05-23 NOTE — Telephone Encounter (Signed)
Ginger  Please note above message, epic message from [place] clinic dated 05/05/2022 the patient requested a colonoscopy from [facility] . Back in 2022 seen by [facility] clinic GI for a colonoscopy to be done by Dr. Alice Reichert.  It appears she transferred care to them at that point of time prior to which was seen by [place]. Patient has transferred care to [facility] GI in 2022 after seeing [place] in the past.  Dr Jonathon Bellows MD,MRCP [facility]) Gastroenterology/Hepatology Pager: [phone]

## 2022-05-23 NOTE — Telephone Encounter (Signed)
Patient contacted office this afternoon to discuss her referral for her colonoscopy.  I had already spoken with her earlier this morning to let her know although we received the referral from her PCP to schedule her colonoscopy with Dr. Vicente Males, Per Dr. Georgeann Oppenheim chart note dated 04/28/22 "She has been seen by [facility] clinic hence would need to follow-up with them".  She did not recall me speaking with her this morning.  As I noted our conversation in her referral note. Pt then started cursing at me telling me that "Dr. Vicente Males will  see her as a patient".  I explained to her that I will discuss this with him again.  She continued to curse and become loud with me. I informed her our office does not tolerate disrespect from patients and asked her to refrain from using profanity.  She then slammed the phone down.  Thanks, [place], [place]

## 2022-05-24 ENCOUNTER — Telehealth: Payer: Self-pay

## 2022-05-24 NOTE — Telephone Encounter (Signed)
Per pt understands that she is an established pt of [facility] and will need to get colonoscopy done though them. Pt states she will call [facility] to make arrangements. Pt also wants  to  apologize for the way she handled things earlier.

## 2022-07-04 ENCOUNTER — Ambulatory Visit (INDEPENDENT_AMBULATORY_CARE_PROVIDER_SITE_OTHER): Payer: Medicare Other | Admitting: Obstetrics and Gynecology

## 2022-07-04 ENCOUNTER — Encounter: Payer: Self-pay | Admitting: Obstetrics and Gynecology

## 2022-07-04 VITALS — BP 116/68 | HR 108 | Ht 64.4 in | Wt 142.8 lb

## 2022-07-04 DIAGNOSIS — Z0000 Encounter for general adult medical examination without abnormal findings: Secondary | ICD-10-CM

## 2022-07-04 DIAGNOSIS — Z01419 Encounter for gynecological examination (general) (routine) without abnormal findings: Secondary | ICD-10-CM | POA: Diagnosis not present

## 2022-07-04 NOTE — Progress Notes (Signed)
New Patient here to Establish Care.  Pt is [place]. Pt has been in [place] for 52yr.  Last Mammogram:08/2021 up coming appt is already scheduled  Pt has no GYN concerns today.   Fun Fact: Likes to travel and find antiques.

## 2022-07-04 NOTE — Progress Notes (Signed)
Obstetrics and Gynecology New Patient Evaluation  Appointment Date: 07/04/2022  OBGYN Clinic: [facility]  Primary Care Provider: Fulton Reek D  Referring Provider: Idelle Crouch, MD  Chief Complaint:  Chief Complaint  Patient presents with   Establish Care    History of Present Illness: Diane Kirby is a 77 y.o. G3P3 (LMP: late 63s), seen for the above chief complaint.   Patient just here to establish care. She denies any GYN problems or issues.   Review of Systems: A comprehensive review of systems was negative.     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 [facility])    Hypertension     Past Surgical History:  Past Surgical History:  Procedure Laterality Date   big toe surgery     BREAST EXCISIONAL BIOPSY Left    benign-cyst removed 30 years ago   [place] WITH BILATERAL TUBAL LIGATION     ESOPHAGOGASTRODUODENOSCOPY (EGD) WITH PROPOFOL N/A 02/26/2019   Procedure: ESOPHAGOGASTRODUODENOSCOPY (EGD) WITH PROPOFOL;  Surgeon: Jonathon Bellows, MD;  Location: [facility] ENDOSCOPY;  Service: Gastroenterology;  Laterality: N/A;   HERNIA REPAIR     VEIN LIGATION AND STRIPPING      Past Obstetrical History:  OB History  Gravida Para Term Preterm AB Living  3         3  SAB IAB Ectopic Multiple Live Births               # Outcome Date GA Lbr Len/2nd Weight Sex Delivery Anes PTL Lv  3 Gravida           2 Gravida           1 Gravida             Obstetric Comments   3 C-Sections.     Past Gynecological History: As per HPI. History of Pap Smear(s): Yes.   Last pap 2-3 years ago before moving down from [place]. No h/o abnormal paps History of HRT use: No.  Social History:  Social History   Socioeconomic History   Marital status: Married    Spouse name: Not on file   Number of children: Not on file   Years of  education: Not on file   Highest education level: Not on file  Occupational History   Not on file  Tobacco Use   Smoking status: Former    Packs/day: 1.00    Years: 40.00    Total pack years: 40.00    Types: Cigarettes    Quit date: 02/26/2000    Years since quitting: 22.3   Smokeless tobacco: Never   Tobacco comments:    Quit 15 yrs ago.  Vaping Use   Vaping Use: Never used  Substance and Sexual Activity   Alcohol use: Yes    Comment: 2 glasses of wine daily   Drug use: Never   Sexual activity: Not Currently  Other Topics Concern   Not on file  Social History Narrative   Not on file   Social Determinants of Health   Financial Resource Strain: Not on file  Food Insecurity: Not on file  Transportation Needs: Not on file  Physical Activity: Not on file  Stress: Not on file  Social Connections: Not on file  Intimate Partner Violence: Not on file    Family  History:  Family History  Problem Relation Age of Onset   Epilepsy Mother    Stroke Father     Medications Milagros Evener had no medications administered during this visit. Current Outpatient Medications  Medication Sig Dispense Refill   amLODipine (NORVASC) 10 MG tablet      ARIPiprazole (ABILIFY) 2 MG tablet Take 2 mg by mouth daily.     Calcium Carbonate (CALCIUM 600 PO) Take by mouth daily.     doxepin (SINEQUAN) 75 MG capsule Take by mouth.     FLUoxetine (PROZAC) 40 MG capsule Take by mouth.     ipratropium (ATROVENT) 0.03 % nasal spray      Multiple Vitamin (MULTIVITAMIN) capsule Take 1 capsule by mouth daily.     amLODipine (NORVASC) 10 MG tablet Take 1 tablet by mouth daily.     atenolol (TENORMIN) 50 MG tablet Take by mouth.     Cholecalciferol (VITAMIN D3 PO) Take by mouth. 5000 (Patient not taking: Reported on 07/04/2022)     Cholecalciferol 50 MCG (2000 UT) TABS Take by mouth.     FLUoxetine (PROZAC) 40 MG capsule Take by mouth.     fluticasone (FLONASE) 50 MCG/ACT nasal spray      gemfibrozil  (LOPID) 600 MG tablet Take 600 mg by mouth 2 (two) times daily as needed. (Patient not taking: Reported on 07/04/2022)     omeprazole (PRILOSEC) 20 MG capsule Take 1 capsule (20 mg total) by mouth daily. (Patient not taking: Reported on 07/04/2022) 90 capsule 3   pravastatin (PRAVACHOL) 40 MG tablet  (Patient not taking: Reported on 07/04/2022)     traZODone (DESYREL) 50 MG tablet  (Patient not taking: Reported on 07/04/2022)     No current facility-administered medications for this visit.    Allergies Patient has no known allergies.   Physical Exam:  BP 116/68   Pulse (!) 108   Ht 5' 4.4" (1.636 m)   Wt 142 lb 12.8 oz (64.8 kg)   LMP  (LMP Unknown)   BMI 24.21 kg/m  Body mass index is 24.21 kg/m. General appearance: Well nourished, well developed female in no acute distress.  Breast: declined Neuro/Psych:  Normal mood and affect.  Pelvic exam: declined  Laboratory: none  Radiology: none  Assessment: patient doing well  Plan:  1. Well woman exam (no gynecological exam)  RTC PRN  No follow-ups on file.  Future Appointments  Date Time Provider [place]  09/20/2022  9:00 AM [facility] MM [facility]    Aletha Halim, Brooke Bonito MD Attending [facility])

## 2022-09-13 ENCOUNTER — Other Ambulatory Visit: Payer: Self-pay

## 2022-09-13 DIAGNOSIS — R7989 Other specified abnormal findings of blood chemistry: Secondary | ICD-10-CM

## 2022-09-13 DIAGNOSIS — R140 Abdominal distension (gaseous): Secondary | ICD-10-CM

## 2022-09-13 DIAGNOSIS — F1090 Alcohol use, unspecified, uncomplicated: Secondary | ICD-10-CM

## 2022-09-13 DIAGNOSIS — Z789 Other specified health status: Secondary | ICD-10-CM

## 2022-09-15 ENCOUNTER — Ambulatory Visit
Admission: RE | Admit: 2022-09-15 | Discharge: 2022-09-15 | Disposition: A | Discharge status: Home / Self Care | Payer: Medicare Other | Source: Ambulatory Visit | Attending: Gastroenterology | Admitting: Gastroenterology

## 2022-09-15 DIAGNOSIS — Z789 Other specified health status: Secondary | ICD-10-CM | POA: Diagnosis present

## 2022-09-15 DIAGNOSIS — R140 Abdominal distension (gaseous): Secondary | ICD-10-CM | POA: Insufficient documentation

## 2022-09-15 DIAGNOSIS — R7989 Other specified abnormal findings of blood chemistry: Secondary | ICD-10-CM | POA: Insufficient documentation

## 2022-09-20 ENCOUNTER — Ambulatory Visit
Admission: RE | Admit: 2022-09-20 | Discharge: 2022-09-20 | Disposition: A | Discharge status: Home / Self Care | Payer: Medicare Other | Source: Ambulatory Visit | Attending: Internal Medicine | Admitting: Internal Medicine

## 2022-09-20 DIAGNOSIS — Z1231 Encounter for screening mammogram for malignant neoplasm of breast: Secondary | ICD-10-CM | POA: Diagnosis present

## 2022-10-10 ENCOUNTER — Other Ambulatory Visit: Payer: Self-pay | Admitting: Gastroenterology

## 2022-10-10 DIAGNOSIS — R768 Other specified abnormal immunological findings in serum: Secondary | ICD-10-CM

## 2022-10-10 DIAGNOSIS — R748 Abnormal levels of other serum enzymes: Secondary | ICD-10-CM

## 2022-10-10 DIAGNOSIS — R7989 Other specified abnormal findings of blood chemistry: Secondary | ICD-10-CM

## 2022-10-20 ENCOUNTER — Other Ambulatory Visit: Payer: Self-pay | Admitting: Radiology

## 2022-10-20 ENCOUNTER — Other Ambulatory Visit: Payer: Self-pay | Admitting: Internal Medicine

## 2022-10-20 DIAGNOSIS — Z01812 Encounter for preprocedural laboratory examination: Secondary | ICD-10-CM

## 2022-10-20 NOTE — Progress Notes (Signed)
Patient for Diane Kirby guided Liver Biopsy on Friday 10/21/2022, I called and spoke with the patient on the phone and gave pre-procedure instructions. Pt was made aware to be here at 7:30a, NPO after MN prior to procedure as well as driver post procedure/recovery/discharge. Pt stated understanding.  Called 10/20/2022

## 2022-10-21 ENCOUNTER — Ambulatory Visit
Admission: RE | Admit: 2022-10-21 | Discharge: 2022-10-21 | Disposition: A | Discharge status: Home / Self Care | Payer: Medicare Other | Source: Ambulatory Visit | Attending: Gastroenterology | Admitting: Gastroenterology

## 2022-10-21 ENCOUNTER — Other Ambulatory Visit: Payer: Self-pay

## 2022-10-21 DIAGNOSIS — K7401 Hepatic fibrosis, early fibrosis: Secondary | ICD-10-CM | POA: Insufficient documentation

## 2022-10-21 DIAGNOSIS — R748 Abnormal levels of other serum enzymes: Secondary | ICD-10-CM | POA: Diagnosis not present

## 2022-10-21 DIAGNOSIS — Z01812 Encounter for preprocedural laboratory examination: Secondary | ICD-10-CM

## 2022-10-21 DIAGNOSIS — R7989 Other specified abnormal findings of blood chemistry: Secondary | ICD-10-CM | POA: Diagnosis present

## 2022-10-21 DIAGNOSIS — R768 Other specified abnormal immunological findings in serum: Secondary | ICD-10-CM | POA: Diagnosis not present

## 2022-10-21 DIAGNOSIS — K7581 Nonalcoholic steatohepatitis (NASH): Secondary | ICD-10-CM | POA: Diagnosis not present

## 2022-10-21 LAB — CBC
HCT: 44.9 % (ref 36.0–46.0)
Hemoglobin: 14.6 g/dL (ref 12.0–15.0)
MCH: 30.4 pg (ref 26.0–34.0)
MCHC: 32.5 g/dL (ref 30.0–36.0)
MCV: 93.3 fL (ref 80.0–100.0)
Platelets: 300 10*3/uL (ref 150–400)
RBC: 4.81 MIL/uL (ref 3.87–5.11)
RDW: 13.1 % (ref 11.5–15.5)
WBC: 8.6 10*3/uL (ref 4.0–10.5)
nRBC: 0 % (ref 0.0–0.2)

## 2022-10-21 LAB — PROTIME-INR
INR: 0.9 (ref 0.8–1.2)
Prothrombin Time: 12.8 seconds (ref 11.4–15.2)

## 2022-10-21 MED ORDER — MIDAZOLAM HCL 5 MG/5ML IJ SOLN
INTRAMUSCULAR | Status: AC | PRN
  Administered 2022-10-21: 1 mg via INTRAVENOUS

## 2022-10-21 MED ORDER — LIDOCAINE HCL (PF) 1 % IJ SOLN
10.0000 mL | Freq: Once | INTRAMUSCULAR | Status: AC
  Administered 2022-10-21: 10 mL via INTRADERMAL
  Filled 2022-10-21: qty 10

## 2022-10-21 MED ORDER — FENTANYL CITRATE (PF) 100 MCG/2ML IJ SOLN
INTRAMUSCULAR | Status: AC | PRN
  Administered 2022-10-21: 50 ug via INTRAVENOUS
  Administered 2022-10-21: 25 ug via INTRAVENOUS

## 2022-10-21 MED ORDER — FENTANYL CITRATE (PF) 100 MCG/2ML IJ SOLN
INTRAMUSCULAR | Status: AC
  Filled 2022-10-21: qty 2

## 2022-10-21 MED ORDER — SODIUM CHLORIDE 0.9 % IV SOLN: INTRAVENOUS | Status: DC

## 2022-10-21 MED ORDER — MIDAZOLAM HCL 2 MG/2ML IJ SOLN
INTRAMUSCULAR | Status: AC
  Filled 2022-10-21: qty 2

## 2022-10-21 MED ORDER — MIDAZOLAM HCL 2 MG/2ML IJ SOLN
INTRAMUSCULAR | Status: AC | PRN
  Administered 2022-10-21: .5 mg via INTRAVENOUS

## 2022-10-21 NOTE — Progress Notes (Signed)
Patient clinically stable post US Liver biopsy per Dr Juliette Alcide, tolerated well. Vitals stable pre and post procedure. Received Versed 1.5 mg along with Fentanyl 75 mcg IV for procedure. Denies complaints post procedure. No visible bleeding post procedure per [place], report given to [facility] RN post procedure/specials/11.

## 2022-10-21 NOTE — H&P (Signed)
Interventional Radiology - Pre-procedure H&P    Referring Provider: Gilda Crease, PA-C  Planned Procedure: Random liver core biopsy     History of Present Illness  Diane Kirby is a 77 y.o. female with a relevant past medical history of elevated LFTs seen today for random liver core biopsy.  Reports being in otherwise in usual state of health.    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 [facility])    Hypertension     Surgical History  Past Surgical History:  Procedure Laterality Date   big toe surgery     BREAST EXCISIONAL BIOPSY Left    benign-cyst removed 30 years ago   BREAST SURGERY     CESAREAN SECTION     CESAREAN SECTION     CESAREAN SECTION WITH BILATERAL TUBAL LIGATION     ESOPHAGOGASTRODUODENOSCOPY (EGD) WITH PROPOFOL N/A 02/26/2019   Procedure: ESOPHAGOGASTRODUODENOSCOPY (EGD) WITH PROPOFOL;  Surgeon: Wyline Mood, MD;  Location: [facility] ENDOSCOPY;  Service: Gastroenterology;  Laterality: N/A;   HERNIA REPAIR     VEIN LIGATION AND STRIPPING       Medications  I have reviewed the current medication list. Refer to chart for details. Current Outpatient Medications  Medication Instructions   amLODipine (NORVASC) 10 MG tablet No dose, route, or frequency recorded.   amLODipine (NORVASC) 10 MG tablet 1 tablet, Oral, Daily   ARIPiprazole (ABILIFY) 2 mg, Oral, Daily   atenolol (TENORMIN) 50 MG tablet Oral   Calcium Carbonate (CALCIUM 600 PO) Oral, Daily   Cholecalciferol (VITAMIN D3 PO) Take by mouth. 5000   Cholecalciferol 50 MCG (2000 UT) TABS Oral   doxepin (SINEQUAN) 75 MG capsule Oral   FLUoxetine (PROZAC) 40 MG capsule Oral   FLUoxetine (PROZAC) 40 MG capsule Oral   fluticasone (FLONASE) 50 MCG/ACT nasal spray No dose, route, or frequency recorded.   gemfibrozil (LOPID) 600 mg, 2 times daily PRN   ipratropium (ATROVENT) 0.03 % nasal spray No dose, route, or frequency recorded.    Multiple Vitamin (MULTIVITAMIN) capsule 1 capsule, Oral, Daily   omeprazole (PRILOSEC) 20 mg, Oral, Daily   pravastatin (PRAVACHOL) 40 MG tablet No dose, route, or frequency recorded.   rosuvastatin (CRESTOR) 5 mg, Oral, Daily   traZODone (DESYREL) 50 MG tablet No dose, route, or frequency recorded.      Allergies No Known Allergies   Physical Exam Current Vitals Temp: 97.9 F (36.6 C) (Temp Source: Oral)  Pulse Rate: 83  Resp: 16  BP: 116/70  SpO2: 92 %  Height: 5' 4.5" (163.8 cm)  Weight: 64.9 kg  Body mass index is 24.17 kg/m.  General: Alert and answers questions appropriately. No apparent distress. HEENT: Normocephalic, atraumatic. Conjunctivae normal without scleral icterus. Mallampati score: II (hard and soft palate, upper portion of tonsils anduvula visible) Cardiac: Regular rate and rhythm. No dependent edema. Pulmonary: Normal work of breathing. On room air. Abdominal: Soft without distension. Extremities: Normally-formed, well perfused.    Pertinent Lab Results Labs: CBC: WBC/Hgb/Hct/Plts:  8.6/14.6/44.9/300 (06/28 0757)  BMP:   Coagulation: PT/INR/PTT:  --/0.9/-- (06/28 0757)  CBC Trends: Recent Labs    10/21/22 0757  WBC 8.6  HGB 14.6  HCT 44.9  PLT 300     Creatinine Trend: No results for input(s): "CREATININE" in the last 72 hours.   Relevant and/or Recent Imaging: None    Assessment & Plan Diane Kirby is a 77 y.o. female with a history of  elevated LFTs seen today for random liver core needle biopsy.   Patient is appropriate candidate for the procedure. Risks and benefits discussed, including but not limited to procedure-specific risks of bleeding and infection, and patient is agreeable to proceed.    Procedure Checklist:  Consent obtained: Risks of the procedure as well as the alternatives and risks of each were explained to the patient and/or caregiver.  Consent for the procedure was obtained and is signed in the bedside  chart Consent obtained from: The patient Patient is appropriate candidate for sedation Yes ASA Classification: ASA 2 - Patient with mild systemic disease with no functional limitations NPO status: 0000  Code status:   Code Status: Not on file Pre-procedural prep necessary: none      I spent a total of 15 Minutes in face-to-face in clinical consultation, greater than 50% of which was spent on medical decision-making and counseling/coordinating care for liver biopsy.     Olive Bass, MD  Vascular and Interventional Radiology 10/21/2022 8:47 AM

## 2022-10-21 NOTE — Procedures (Signed)
Interventional Radiology Procedure Note  Date of Procedure: 10/21/2022  Procedure: Random liver core needle biopsy   Findings:  1. Random liver core needle biopsy right liver lobe    Complications: No immediate complications noted.   Estimated Blood Loss: minimal  Follow-up and Recommendations: 1. Bedrest 2 hours    Olive Bass, MD  Vascular & Interventional Radiology  10/21/2022 8:49 AM

## 2022-11-01 ENCOUNTER — Ambulatory Visit: Payer: Medicare Other

## 2022-11-01 DIAGNOSIS — D122 Benign neoplasm of ascending colon: Secondary | ICD-10-CM | POA: Diagnosis not present

## 2022-11-01 DIAGNOSIS — K640 First degree hemorrhoids: Secondary | ICD-10-CM

## 2022-11-01 DIAGNOSIS — K5730 Diverticulosis of large intestine without perforation or abscess without bleeding: Secondary | ICD-10-CM | POA: Diagnosis not present

## 2022-11-01 DIAGNOSIS — D123 Benign neoplasm of transverse colon: Secondary | ICD-10-CM | POA: Diagnosis not present

## 2022-11-01 DIAGNOSIS — Z1211 Encounter for screening for malignant neoplasm of colon: Secondary | ICD-10-CM | POA: Diagnosis not present

## 2023-10-16 IMAGING — MG MM DIGITAL SCREENING BILAT W/ TOMO AND CAD
6 of 10 series · 6 of 30 positions shown · non-contrast
Comparison: Previous exam(s).

CLINICAL DATA: Screening.

EXAM:
DIGITAL SCREENING BILATERAL MAMMOGRAM WITH TOMOSYNTHESIS AND CAD
TECHNIQUE: Bilateral screening digital craniocaudal and mediolateral oblique
mammograms were obtained. Bilateral screening digital breast
tomosynthesis was performed. The images were evaluated with
computer-aided detection.

[R MLO synth-2D]
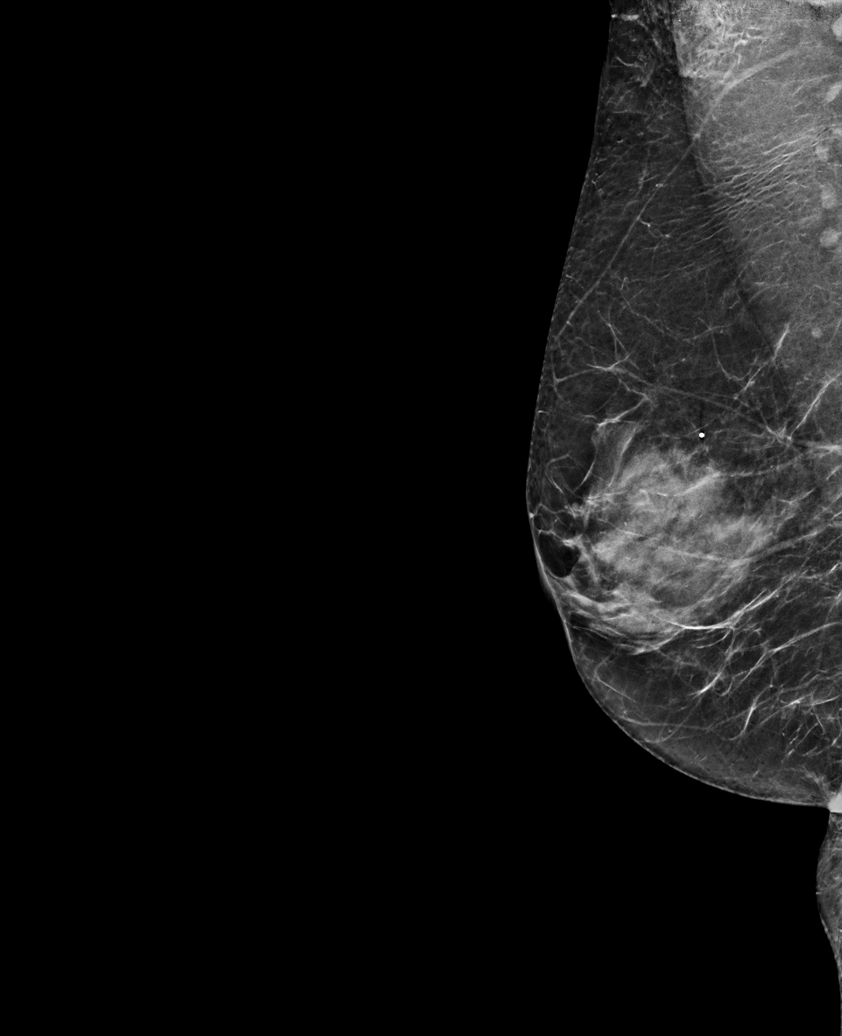

[L XCCL synth-2D]
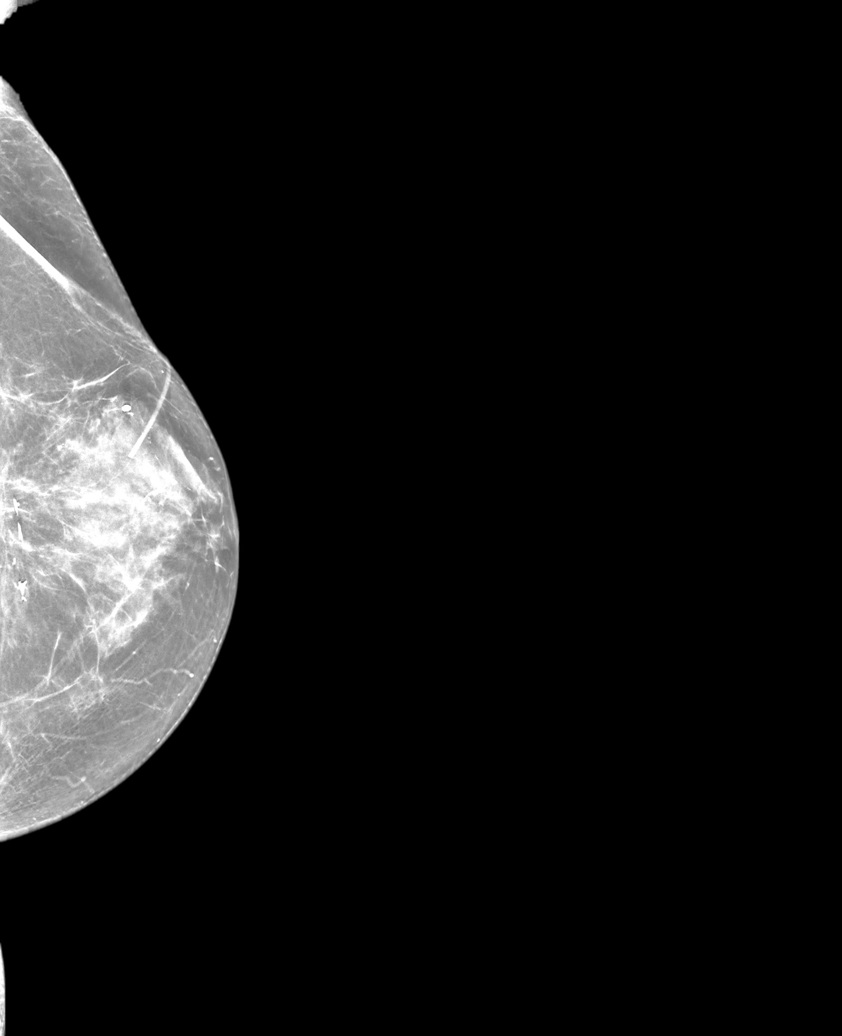

[R CC synth-2D]
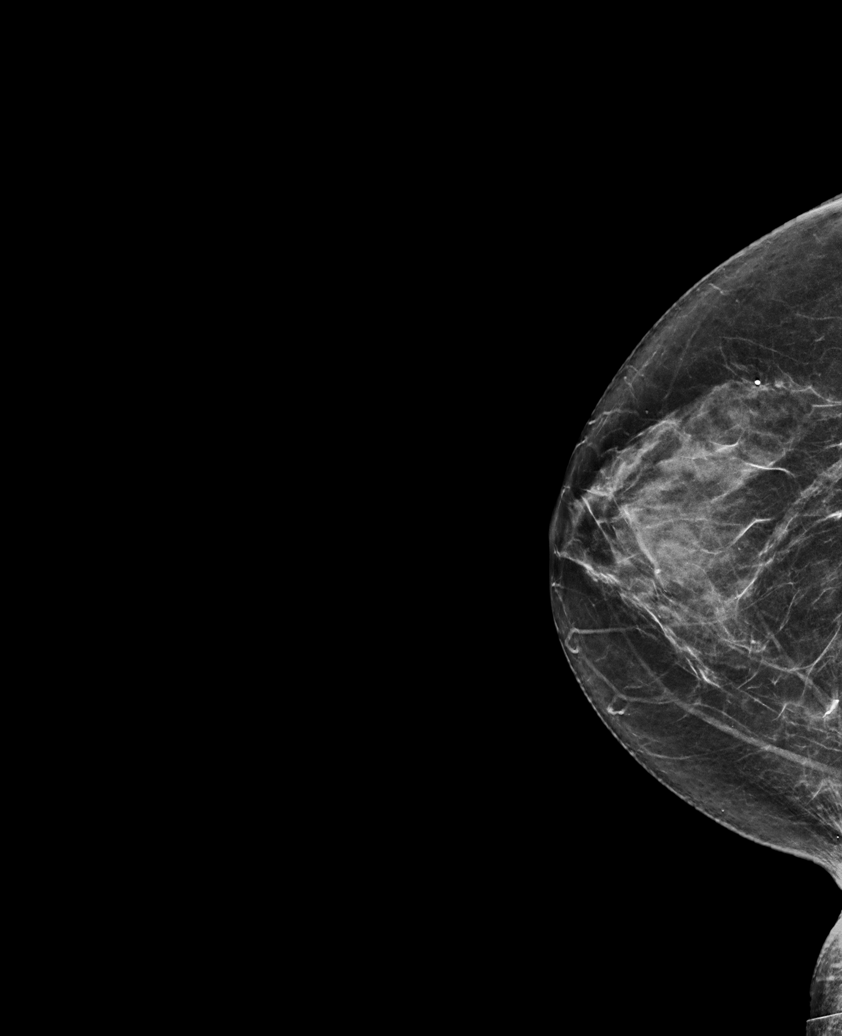

[L MLO synth-2D]
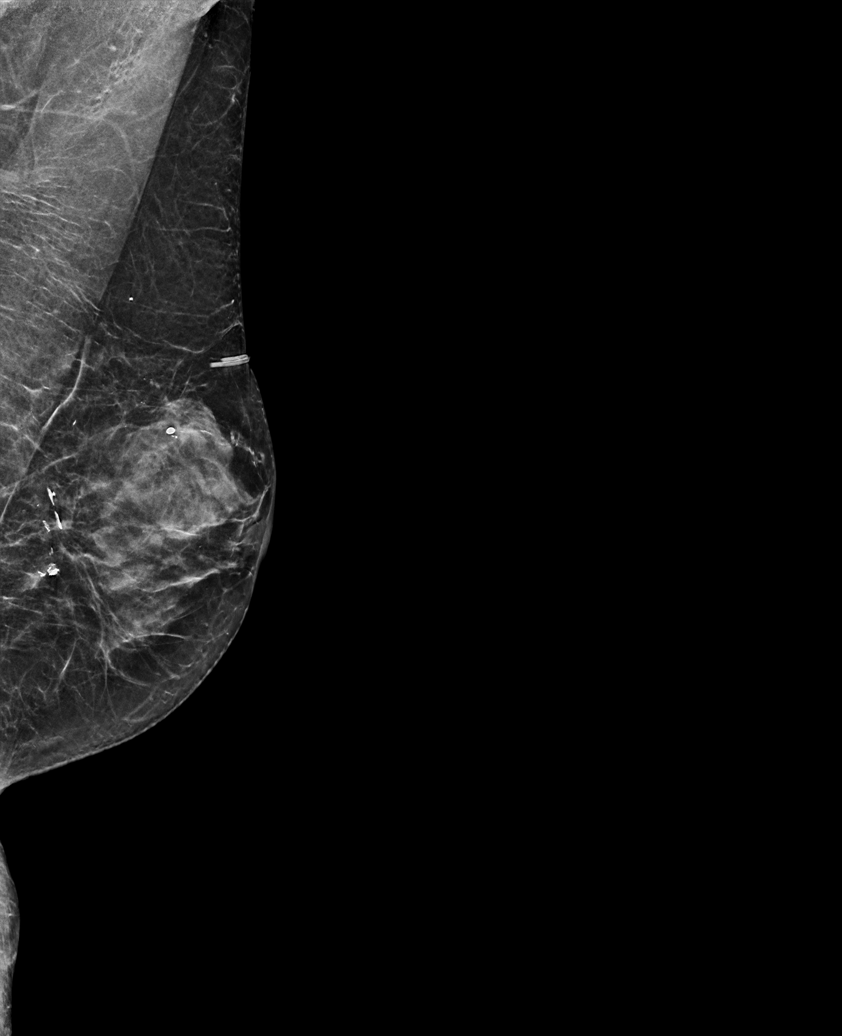

[L CC synth-2D]
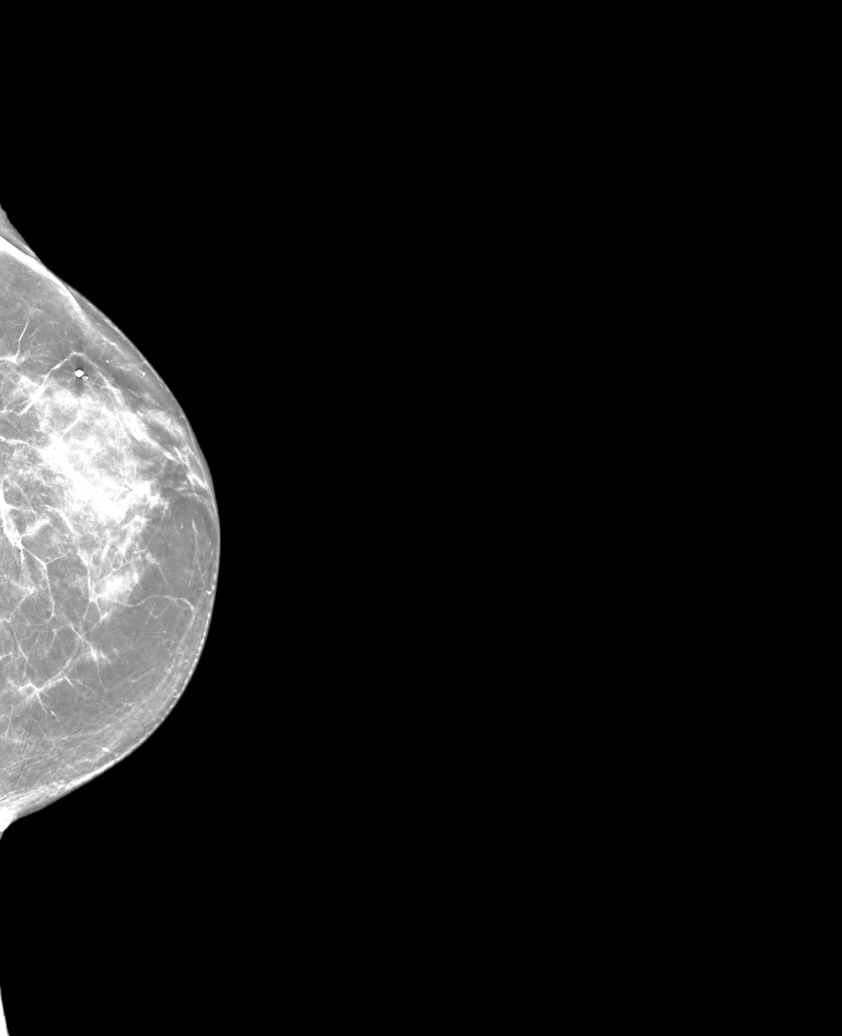

[R CC tomo · tomo slice 31/62.0]
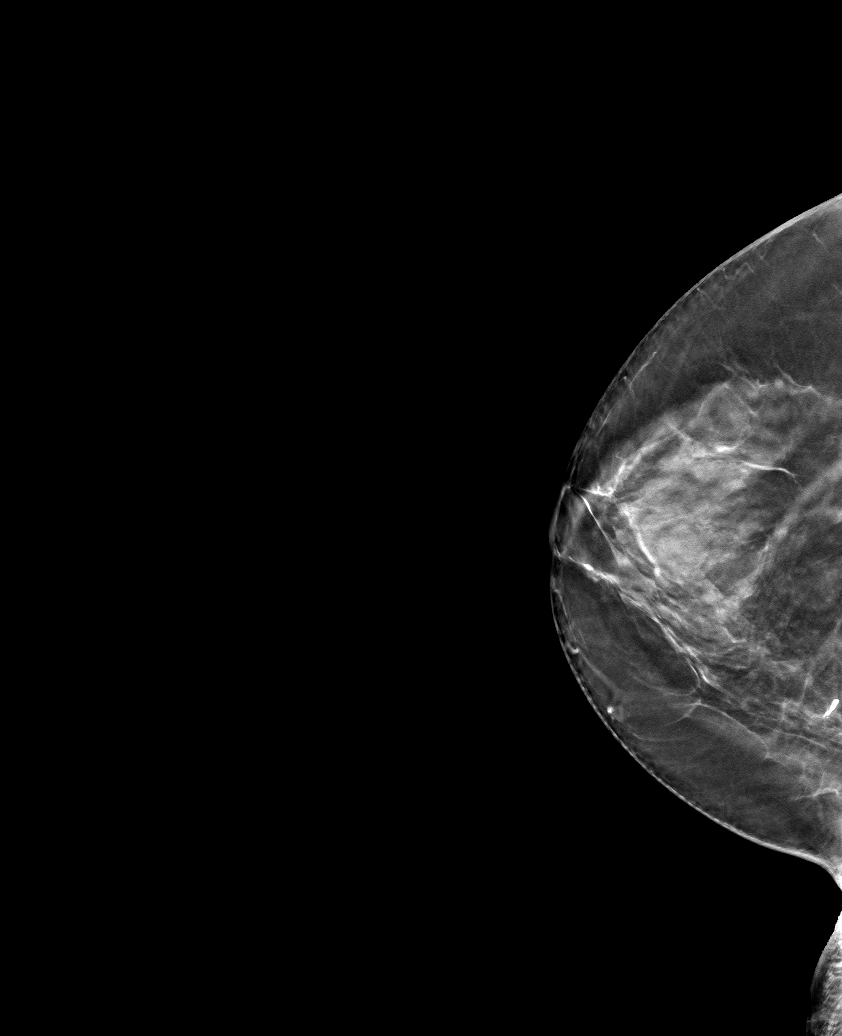

[6 of 30 positions shown; findings below may reference images not displayed]

ACR Breast Density Category c: The breast tissue is heterogeneously
dense, which may obscure small masses.
FINDINGS: There are no findings suspicious for malignancy. LEFT breast
surgical changes again noted.
IMPRESSION: No mammographic evidence of malignancy. A result letter of this
screening mammogram will be mailed directly to the patient.

RECOMMENDATION:
Screening mammogram in one year. (Code:[id number])

BI-RADS CATEGORY  2: Benign.

## 2024-01-26 ENCOUNTER — Emergency Department

## 2024-01-26 ENCOUNTER — Other Ambulatory Visit: Payer: Self-pay

## 2024-01-26 ENCOUNTER — Emergency Department
Admission: EM | Admit: 2024-01-26 | Discharge: 2024-01-26 | Disposition: A | Discharge status: Home / Self Care | Attending: Emergency Medicine | Admitting: Emergency Medicine

## 2024-01-26 DIAGNOSIS — R0602 Shortness of breath: Secondary | ICD-10-CM | POA: Diagnosis not present

## 2024-01-26 DIAGNOSIS — D72829 Elevated white blood cell count, unspecified: Secondary | ICD-10-CM | POA: Diagnosis not present

## 2024-01-26 DIAGNOSIS — R0689 Other abnormalities of breathing: Secondary | ICD-10-CM | POA: Diagnosis not present

## 2024-01-26 DIAGNOSIS — R002 Palpitations: Secondary | ICD-10-CM | POA: Diagnosis present

## 2024-01-26 LAB — DIFFERENTIAL
Abs Immature Granulocytes: 0.08 K/uL — ABNORMAL HIGH (ref 0.00–0.07)
Basophils Absolute: 0.1 K/uL (ref 0.0–0.1)
Basophils Relative: 1 %
Eosinophils Absolute: 0.2 K/uL (ref 0.0–0.5)
Eosinophils Relative: 1 %
Immature Granulocytes: 1 %
Lymphocytes Relative: 19 %
Lymphs Abs: 3.1 K/uL (ref 0.7–4.0)
Monocytes Absolute: 1 K/uL (ref 0.1–1.0)
Monocytes Relative: 6 %
Neutro Abs: 12.4 K/uL — ABNORMAL HIGH (ref 1.7–7.7)
Neutrophils Relative %: 72 %

## 2024-01-26 LAB — CBC
HCT: 44 % (ref 36.0–46.0)
Hemoglobin: 14.5 g/dL (ref 12.0–15.0)
MCH: 29.7 pg (ref 26.0–34.0)
MCHC: 33 g/dL (ref 30.0–36.0)
MCV: 90 fL (ref 80.0–100.0)
Platelets: 347 K/uL (ref 150–400)
RBC: 4.89 MIL/uL (ref 3.87–5.11)
RDW: 13.6 % (ref 11.5–15.5)
WBC: 21 K/uL — ABNORMAL HIGH (ref 4.0–10.5)
nRBC: 0 % (ref 0.0–0.2)

## 2024-01-26 LAB — BASIC METABOLIC PANEL WITH GFR
Anion gap: 10 (ref 5–15)
BUN: 17 mg/dL (ref 8–23)
CO2: 23 mmol/L (ref 22–32)
Calcium: 9.6 mg/dL (ref 8.9–10.3)
Chloride: 104 mmol/L (ref 98–111)
Creatinine, Ser: 0.63 mg/dL (ref 0.44–1.00)
GFR, Estimated: >60 mL/min (ref >60)
Glucose, Bld: 93 mg/dL (ref 70–99)
Potassium: 3.7 mmol/L (ref 3.5–5.1)
Sodium: 137 mmol/L (ref 135–145)

## 2024-01-26 LAB — HEPATIC FUNCTION PANEL
ALT: 42 U/L (ref 0–44)
AST: 40 U/L (ref 15–41)
Albumin: 4.3 g/dL (ref 3.5–5.0)
Alkaline Phosphatase: 135 U/L — ABNORMAL HIGH (ref 38–126)
Bilirubin, Direct: <0.1 mg/dL (ref 0.0–0.2)
Total Bilirubin: 0.4 mg/dL (ref 0.0–1.2)
Total Protein: 8.5 g/dL — ABNORMAL HIGH (ref 6.5–8.1)

## 2024-01-26 LAB — TROPONIN I (HIGH SENSITIVITY)
Troponin I (High Sensitivity): 4 ng/L (ref <18)
Troponin I (High Sensitivity): 4 ng/L (ref <18)

## 2024-01-26 MED ORDER — IPRATROPIUM-ALBUTEROL 0.5-2.5 (3) MG/3ML IN SOLN
3.0000 mL | Freq: Once | RESPIRATORY_TRACT | Status: AC
  Administered 2024-01-26: 3 mL via RESPIRATORY_TRACT
  Filled 2024-01-26: qty 3

## 2024-01-26 MED ORDER — IOHEXOL 350 MG/ML SOLN
75.0000 mL | Freq: Once | INTRAVENOUS | Status: AC | PRN
  Administered 2024-01-26: 75 mL via INTRAVENOUS

## 2024-01-26 NOTE — ED Provider Notes (Signed)
 [facility] Provider Note    Event Date/Time   First MD Initiated Contact with Patient 01/26/24 1802     (approximate)   History   Palpitations (Since April 7th)   HPI  MAIRIN LINDSLEY is a 78 y.o. female  who presents to the emergency department today from walk in clinic where she went because of concern for palpitations and difficulty with breathing. Symptoms have been present for roughly 2 weeks. She did have a similar episode back in April of this year that occurred after she flew up to [place] . Had it worked up at that time without any clear etiology found. It went away on its own. She denies any current fevers or chills, denies any lower extremity edema. At walk in clinic they were concerned for possible blood clot or arrhythmia.        Physical Exam   Triage Vital Signs: ED Triage Vitals [01/26/24 1752]  Encounter Vitals Group     BP (!) 147/71     Girls Systolic BP Percentile      Girls Diastolic BP Percentile      Boys Systolic BP Percentile      Boys Diastolic BP Percentile      Pulse Rate 96     Resp 16     Temp 98 F (36.7 C)     Temp Source Oral     SpO2 98 %     Weight      Height 5' 4 (1.626 m)     Head Circumference      Peak Flow      Pain Score 0     Pain Loc      Pain Education      Exclude from Growth Chart     Most recent vital signs: Vitals:   01/26/24 1752  BP: (!) 147/71  Pulse: 96  Resp: 16  Temp: 98 F (36.7 C)  SpO2: 98%   General: Awake, alert, oriented. CV:  Good peripheral perfusion. Regular rate and rhythm. Resp:  Normal effort. Lungs clear. Abd:  No distention.  Other:  No lower extremity edema.    ED Results / Procedures / Treatments   Labs (all labs ordered are listed, but only abnormal results are displayed) Labs Reviewed  CBC - Abnormal; Notable for the following components:      Result Value   WBC 21.0 (*)    All other components within normal limits  HEPATIC FUNCTION PANEL -  Abnormal; Notable for the following components:   Total Protein 8.5 (*)    Alkaline Phosphatase 135 (*)    All other components within normal limits  DIFFERENTIAL - Abnormal; Notable for the following components:   Neutro Abs 12.4 (*)    Abs Immature Granulocytes 0.08 (*)    All other components within normal limits  CULTURE, BLOOD (ROUTINE X 2)  CULTURE, BLOOD (ROUTINE X 2)  BASIC METABOLIC PANEL WITH GFR  TROPONIN I (HIGH SENSITIVITY)  TROPONIN I (HIGH SENSITIVITY)     EKG  I, Guadalupe Eagles, attending physician, personally viewed and interpreted this EKG  EKG Time: 1757 Rate: 89 Rhythm: normal sinus rhythm Axis: normal Intervals: qtc 433 QRS: narrow ST changes: no st elevation Impression: normal ekg   RADIOLOGY I independently interpreted and visualized the CXR. My interpretation: No pneumonia Radiology interpretation:  IMPRESSION:  1. No acute cardiopulmonary pathology.    I independently interpreted and visualized the CT angio PE. My interpretation: No PE Radiology  interpretation:  IMPRESSION:  1. Negative for acute pulmonary embolus or aortic dissection. Clear  lung fields.  2. Aortic atherosclerosis.    Aortic Atherosclerosis (ICD10-I70.0).        PROCEDURES:  Critical Care performed: No   MEDICATIONS ORDERED IN ED: Medications - No data to display   IMPRESSION / MDM / ASSESSMENT AND PLAN / ED COURSE  I reviewed the triage vital signs and the nursing notes.                              Differential diagnosis includes, but is not limited to, pneumonia, PE, anemia, electrolyte abnormality  Patient's presentation is most consistent with acute presentation with potential threat to life or bodily function.   The patient is on the cardiac monitor to evaluate for evidence of arrhythmia and/or significant heart rate changes.  Patient presented to the emergency department today because of concerns for shortness of breath and palpitation.  Patient  went initially to walk-in clinic where they did have concern for possible PE.  Patient afebrile here.  Not hypoxic.  Did obtain a CT angio PE which did not show any large PE or pneumonia.  Blood work without concerning anemia, no concerning troponin elevation.  Patient's white blood cell count was elevated.  Somewhat unclear etiology.  Patient denies any abdominal pain and abdomen is benign.  Will add on blood cultures.  Did discuss this result with the patient.  Discussed importance of follow-up with primary care.      FINAL CLINICAL IMPRESSION(S) / ED DIAGNOSES   Final diagnoses:  Heavy breathing     Note:  This document was prepared using Dragon voice recognition software and may include unintentional dictation errors.    Floy Roberts, MD 01/26/24 2226

## 2024-01-26 NOTE — ED Notes (Signed)
 BLUE top sent down

## 2024-01-26 NOTE — Progress Notes (Signed)
 Subjective:    Chief Complaint  Patient presents with  . Shortness of Breath    X April 2025 Had first episode in April, was seen by Lamar Manus, PA States she was hyperventilating and felt like her heart was beating too fast Lasted 5 days, then subsided X 9/19 For the last two weeks, pt states she's been having the heart racing, less of the SOB  Denies HA, visual disturbances, n/v, dizziness  Ok to start per [place]  . Palpitations    History was provided by the patient. Diane Kirby is a 78 y.o., female  who presents with 2-week history of racing heart, shortness of breath.  Patient reports had similar episode in April when visiting [place] .  Reports she stepped off the plane, 1 to 2 days later, developed chest discomfort, racing heart, and shortness of breath.  Was seen at a urgent care in [place]  after 2 days, failed to diagnose.  Gave her albuterol inhaler, which did not benefit her.  Reports symptoms subsided after approximately 10 days.  Now with recurrence.  Patient has history of primary biliary cholangitis, hypertension.  Had been on metoprolol previously, was changed to amlodipine by PCP for unknown reason.  Patient reports she was out for a run with her granddaughter, and her granddaughter reported I can hear you breathing.  No complaints of headache or dizziness; currently no chest pain; no nausea, vomiting, diarrhea. Symptoms include: above   Patient denies: above Treatment to date: above   Past Medical History:  Diagnosis Date  . Anxiety   . Depression   . GERD (gastroesophageal reflux disease)   . Hypertension    The following portions of the patient's history were reviewed and updated as appropriate: allergies, current medications, past social history, and problem list.  Review of Systems A complete review of systems was performed.  Positive and pertinent negative responses are documented in the HPI, and all other systems are negative.   Objective:      Vitals:   01/26/24 1611  BP: (!) 142/91  Pulse: 104  Temp: 36.7 C (98.1 F)  TempSrc: Oral  SpO2: 96%  Weight: 64.5 kg (142 lb 3.2 oz)  Height: 162.6 cm (5' 4)  PainSc: 0-No pain    General Appearance:  Well-developed, well-nourished.  Very pleasant and cooperative.  No acute distress. HEENT:  [place]/AT, PERRLA, EOMI, sclera clear, conjunctivae pink.   Neck:  Supple, no JVD or adenopathy. Cardiovascular:, Tachycardic at 100. Lungs:  Clear to auscultation with fair effort. Abdomen:  Soft, nontender, nondistended. Normoactive bowel sounds.  Back: No CVA tenderness. Extremities:    No cyanosis, clubbing, or edema. Neuro:  Alert and orient x4; nonfocal.    Lab/X-ray/Treatments:   EKG-normal sinus rhythm at 95, normal axis, intervals.  Borderline LVH by voltage criteria. Rhythm strip-normal sinus rhythm at 95, no ectopy. Chest x-ray-slightly rotated, large gastric bubble, no previous chest x-rays for comparison.       Assessment:      1. Atypical chest pain -     ECG 12-lead -     Rhythm Strip -     X-ray chest PA and lateral; Future  2. Palpitations -     ECG 12-lead -     Rhythm Strip -     X-ray chest PA and lateral; Future  3. Shortness of breath -     ECG 12-lead -     Rhythm Strip -     X-ray chest PA and lateral; Future  Patient with recent lab work including thyroid, within normal limits except for elevated LFTs-chronic.  Concerned about episodic chest pain, palpitations, and shortness of breath.  Last episode occurring after a flight.  Limited lab work this evening due to maintenance.  Will refer to ED for further evaluation, to likely include CT scan.  Patient voices understanding and appreciation.  I spent a total of 50 minutes in both face-to-face and non-face-to-face activities, excluding procedures performed, for this visit on the date of this encounter 01/26/2024      Plan:   Requested Prescriptions    No prescriptions requested or ordered in this  encounter

## 2024-01-26 NOTE — ED Notes (Signed)
 Fall risk bundle in place. Pt wearing non skid socks, fall risk bracelet, and bed alarm is on. Call bell near pt

## 2024-01-26 NOTE — ED Triage Notes (Signed)
 Pt to ed from [facility] for palpitations that have been going on since April 7th. Pt was seen at [facility] today and was sent here. Pt is caox4, in no acute distress and ambulatory to triage.

## 2024-01-26 NOTE — ED Notes (Signed)
 Fall risk bundle is currently in place.

## 2024-01-26 NOTE — ED Notes (Signed)
 Answered call light, pt needed to use the bathroom, pt is back in the bed, connected pt back up to the monitor.

## 2024-01-31 LAB — CULTURE, BLOOD (ROUTINE X 2)
Culture: NO GROWTH
Culture: NO GROWTH
Special Requests: ADEQUATE
Special Requests: ADEQUATE

## 2024-04-23 ENCOUNTER — Other Ambulatory Visit: Payer: Self-pay | Admitting: Cardiovascular Disease

## 2024-04-23 DIAGNOSIS — R002 Palpitations: Secondary | ICD-10-CM

## 2024-04-23 DIAGNOSIS — R0602 Shortness of breath: Secondary | ICD-10-CM

## 2024-04-23 DIAGNOSIS — R0609 Other forms of dyspnea: Secondary | ICD-10-CM

## 2024-04-23 DIAGNOSIS — R9431 Abnormal electrocardiogram [ECG] [EKG]: Secondary | ICD-10-CM

## 2024-05-10 ENCOUNTER — Encounter (HOSPITAL_COMMUNITY): Payer: Self-pay

## 2024-05-10 ENCOUNTER — Telehealth (HOSPITAL_COMMUNITY): Payer: Self-pay | Admitting: *Deleted

## 2024-05-10 NOTE — Telephone Encounter (Signed)
 Attempted to call patient regarding upcoming cardiac CT appointment. Left message on voicemail with name and callback number  Larey Brick RN Navigator Cardiac Imaging [facility] [phone] Office [phone] Cell

## 2024-05-13 ENCOUNTER — Ambulatory Visit
Admission: RE | Admit: 2024-05-13 | Discharge: 2024-05-13 | Disposition: A | Discharge status: Home / Self Care | Source: Ambulatory Visit | Attending: Cardiovascular Disease | Admitting: Cardiovascular Disease

## 2024-05-13 DIAGNOSIS — R002 Palpitations: Secondary | ICD-10-CM | POA: Insufficient documentation

## 2024-05-13 DIAGNOSIS — R0609 Other forms of dyspnea: Secondary | ICD-10-CM | POA: Diagnosis present

## 2024-05-13 DIAGNOSIS — R9431 Abnormal electrocardiogram [ECG] [EKG]: Secondary | ICD-10-CM | POA: Insufficient documentation

## 2024-05-13 DIAGNOSIS — R0602 Shortness of breath: Secondary | ICD-10-CM | POA: Diagnosis present

## 2024-05-13 MED ORDER — IOHEXOL 350 MG/ML SOLN
100.0000 mL | Freq: Once | INTRAVENOUS | Status: AC | PRN
  Administered 2024-05-13: 100 mL via INTRAVENOUS

## 2024-05-13 MED ORDER — DILTIAZEM HCL 25 MG/5ML IV SOLN: 10.0000 mg | INTRAVENOUS | Status: DC | PRN

## 2024-05-13 MED ORDER — METOPROLOL TARTRATE 5 MG/5ML IV SOLN: 10.0000 mg | Freq: Once | INTRAVENOUS | Status: DC | PRN

## 2024-05-13 MED ORDER — NITROGLYCERIN 0.4 MG SL SUBL
0.8000 mg | SUBLINGUAL_TABLET | Freq: Once | SUBLINGUAL | Status: AC
  Administered 2024-05-13: 0.8 mg via SUBLINGUAL
  Filled 2024-05-13: qty 25
